# Patient Record
Sex: Female | Born: 1950 | Race: White | Hispanic: No | Marital: Married | Smoking: Former smoker
Health system: Southern US, Community
[De-identification: ages and names within clinical notes are randomized; demographics above are authoritative.]

## PROBLEM LIST (undated history)

## (undated) DIAGNOSIS — I8390 Asymptomatic varicose veins of unspecified lower extremity: Secondary | ICD-10-CM

## (undated) DIAGNOSIS — K432 Incisional hernia without obstruction or gangrene: Secondary | ICD-10-CM

## (undated) DIAGNOSIS — K429 Umbilical hernia without obstruction or gangrene: Secondary | ICD-10-CM

## (undated) DIAGNOSIS — J189 Pneumonia, unspecified organism: Secondary | ICD-10-CM

## (undated) DIAGNOSIS — T7840XA Allergy, unspecified, initial encounter: Secondary | ICD-10-CM

## (undated) DIAGNOSIS — J45909 Unspecified asthma, uncomplicated: Secondary | ICD-10-CM

## (undated) HISTORY — DX: Allergy, unspecified, initial encounter: T78.40XA

## (undated) HISTORY — DX: Unspecified asthma, uncomplicated: J45.909

## (undated) HISTORY — DX: Asymptomatic varicose veins of unspecified lower extremity: I83.90

## (undated) HISTORY — DX: Umbilical hernia without obstruction or gangrene: K42.9

## (undated) HISTORY — PX: OTHER SURGICAL HISTORY: SHX169

---

## 2002-01-05 ENCOUNTER — Other Ambulatory Visit: Admission: RE | Admit: 2002-01-05 | Discharge: 2002-01-05 | Payer: Self-pay | Admitting: Obstetrics and Gynecology

## 2003-08-22 ENCOUNTER — Other Ambulatory Visit: Admission: RE | Admit: 2003-08-22 | Discharge: 2003-08-22 | Payer: Self-pay | Admitting: Obstetrics and Gynecology

## 2004-04-03 ENCOUNTER — Encounter (INDEPENDENT_AMBULATORY_CARE_PROVIDER_SITE_OTHER): Payer: Self-pay | Admitting: Specialist

## 2004-04-03 ENCOUNTER — Ambulatory Visit (HOSPITAL_BASED_OUTPATIENT_CLINIC_OR_DEPARTMENT_OTHER): Admission: RE | Admit: 2004-04-03 | Discharge: 2004-04-03 | Payer: Self-pay | Admitting: Obstetrics and Gynecology

## 2004-04-03 ENCOUNTER — Ambulatory Visit (HOSPITAL_COMMUNITY): Admission: RE | Admit: 2004-04-03 | Discharge: 2004-04-03 | Payer: Self-pay | Admitting: Obstetrics and Gynecology

## 2005-02-25 ENCOUNTER — Emergency Department (HOSPITAL_COMMUNITY): Admission: EM | Admit: 2005-02-25 | Discharge: 2005-02-25 | Payer: Self-pay | Admitting: *Deleted

## 2006-02-13 IMAGING — CR DG CHEST 1V PORT
1 series · 1 of 1 positions shown · non-contrast
Comparison: none

CLINICAL DATA: right chest pain

Portable chest at 2680:
No previous for comparison. The heart size and mediastinal contours are within
normal limits.  Both lungs are clear.  The visualized skeletal structures are
unremarkable.

[view not recorded]
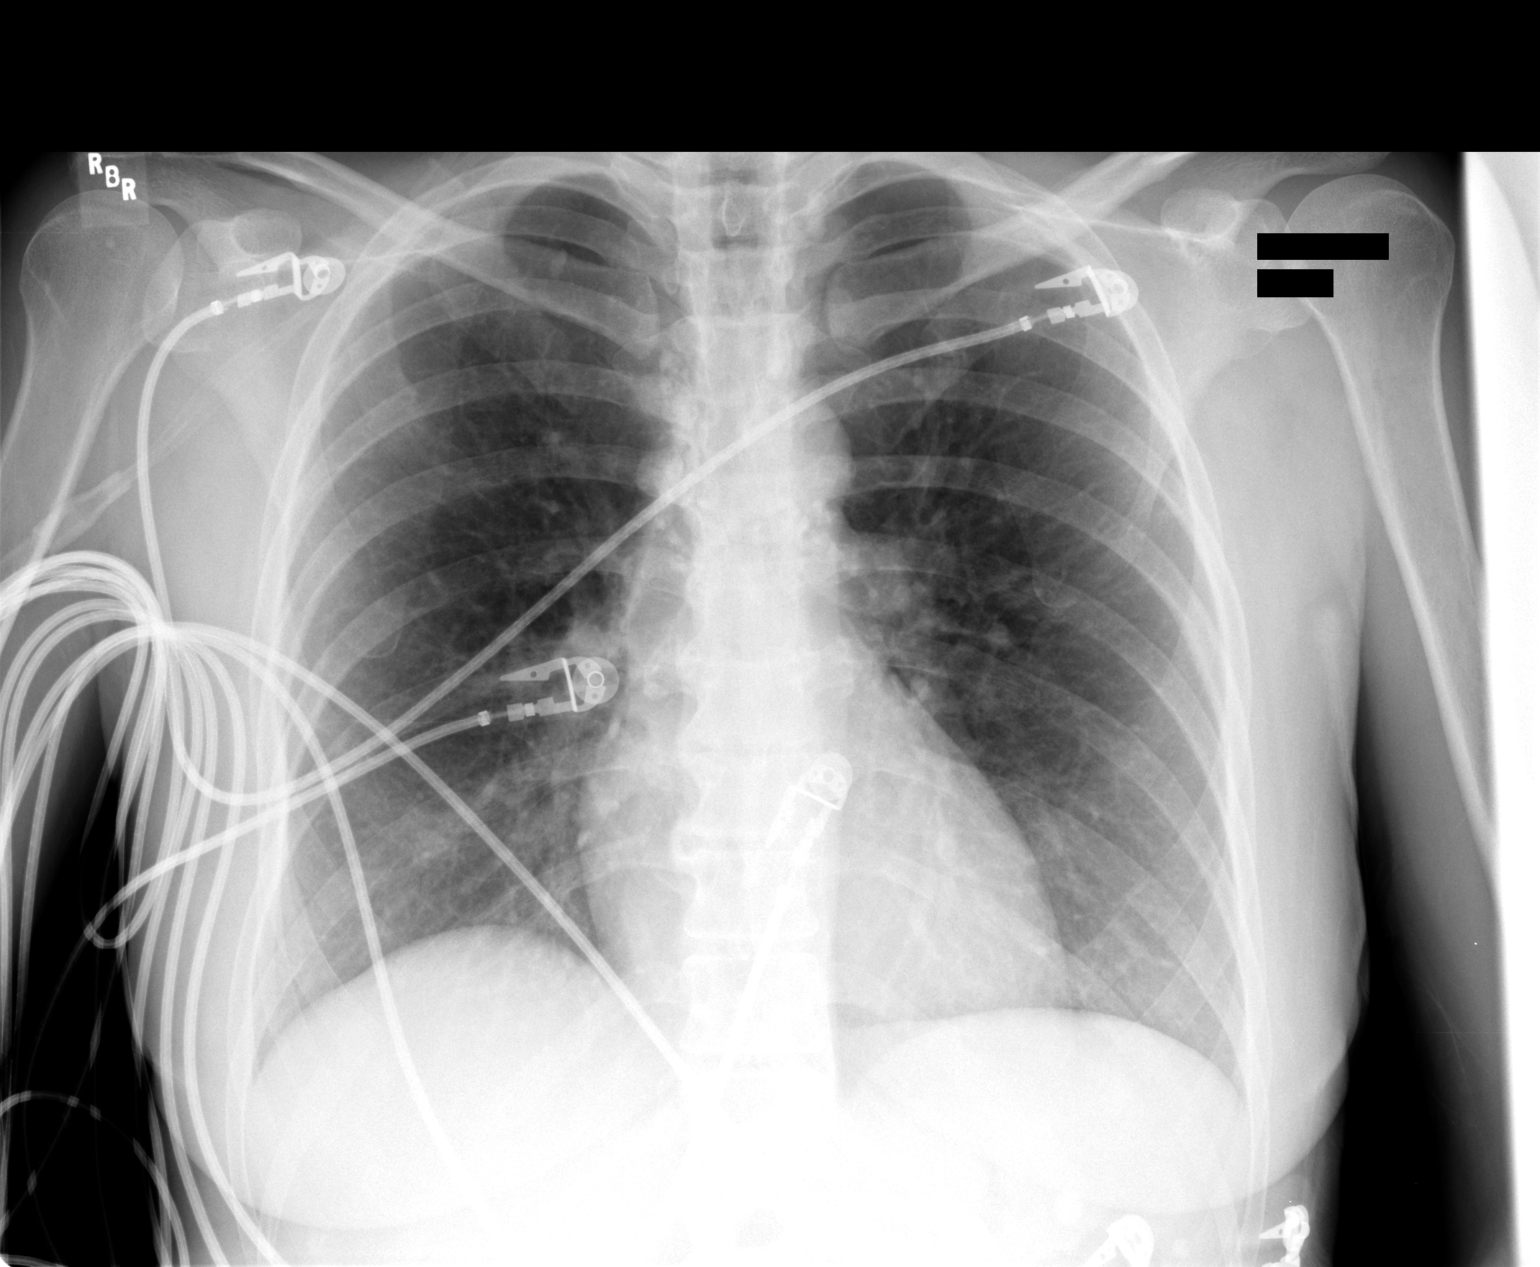

[1 of 1 positions shown; findings below may reference images not displayed]

IMPRESSION: 1. No active cardiopulmonary disease.

## 2006-09-23 ENCOUNTER — Ambulatory Visit (HOSPITAL_COMMUNITY): Admission: RE | Admit: 2006-09-23 | Discharge: 2006-09-23 | Payer: Self-pay | Admitting: Obstetrics and Gynecology

## 2006-10-06 ENCOUNTER — Encounter: Admission: RE | Admit: 2006-10-06 | Discharge: 2006-10-06 | Payer: Self-pay | Admitting: Obstetrics and Gynecology

## 2009-03-09 ENCOUNTER — Encounter: Admission: RE | Admit: 2009-03-09 | Discharge: 2009-03-09 | Payer: Self-pay | Admitting: Orthopedic Surgery

## 2010-02-23 ENCOUNTER — Encounter: Admission: RE | Admit: 2010-02-23 | Discharge: 2010-02-23 | Payer: Self-pay | Admitting: Obstetrics and Gynecology

## 2010-02-25 IMAGING — RF DG FLUORO GUIDE NDL PLC/BX
2 series · 2 of 2 positions shown · non-contrast
Comparison: none

CLINICAL DATA: Right hip pain

[Series 1: (hospital) · 1 of 1 slices shown (1 of 2)]
[im 1/1]
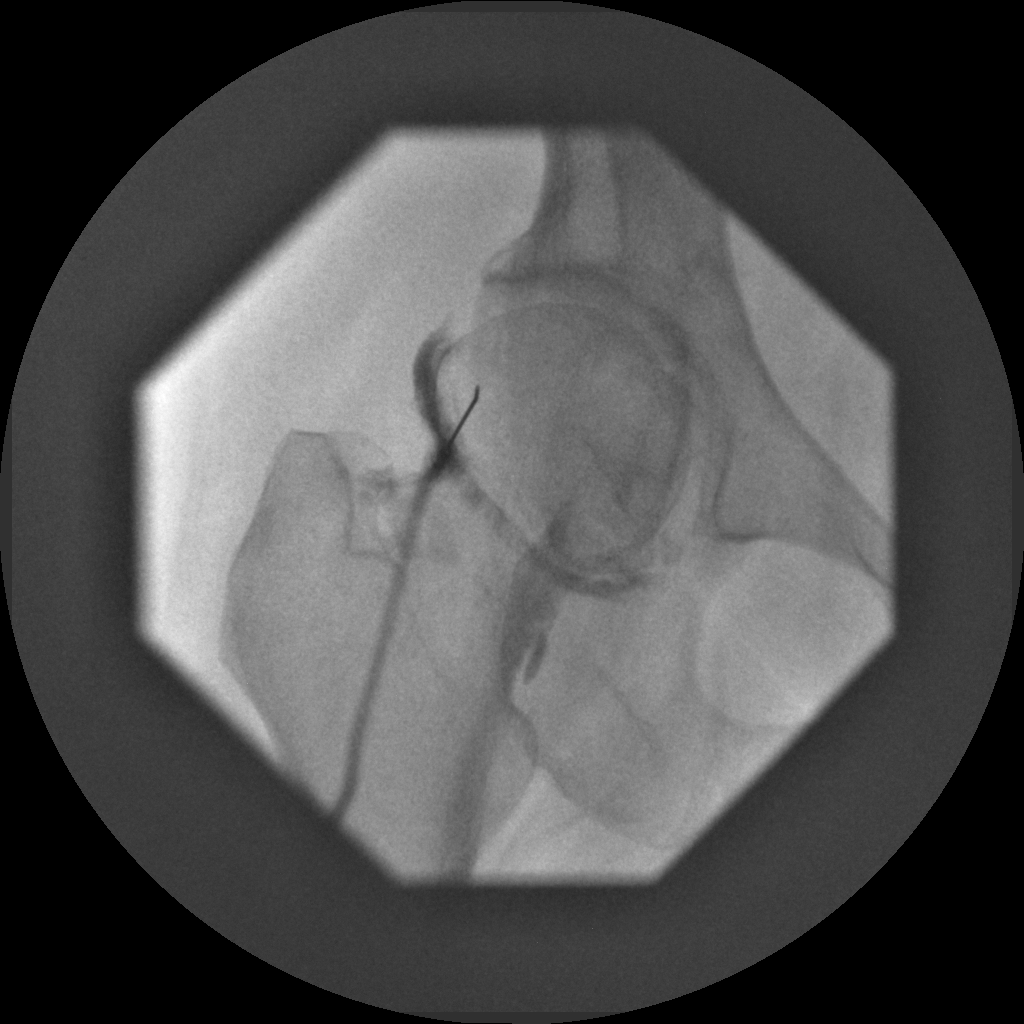

[Series 2: (hospital) · 1 of 1 slices shown (2 of 2)]
[im 1/1]
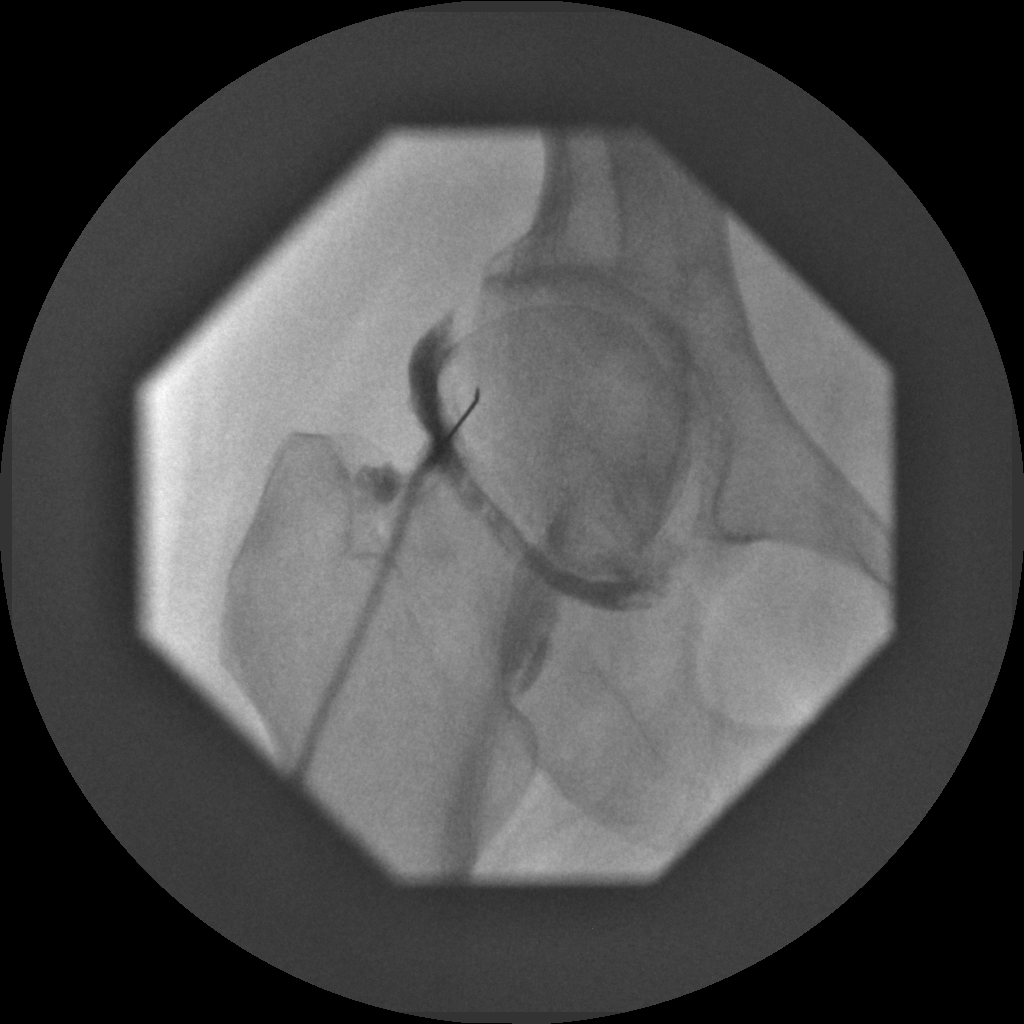

[2 of 2 positions shown; findings below may reference images not displayed]

RIGHT HIP INJECTION UNDER FLUOROSCOPY FOR MRI

An appropriate skin entry site was determined under fluoroscopy.
Skin site was marked, prepped with Betadine, and draped in usual
sterile fashion, and infiltrated locally with 1% lidocaine.
22-gauge spinal needle advanced to the superior lateral margin of
the femoral head. 1 mL of lidocaine 1% injected easily. Eightml of
a mixture of 10ml Shyarul Narudem 60, 5 ml lidocaine 1%, 5 ml normal saline,
and 0.2 ml Magnevist   was injected into the hip joint.
Intraarticular flow was confirmed on fluoroscopy. Patient
transferred to MRI.

IMPRESSION
1. Technically successful right hip injection for MRI

## 2010-12-29 ENCOUNTER — Encounter: Payer: Self-pay | Admitting: Obstetrics and Gynecology

## 2010-12-30 ENCOUNTER — Encounter: Payer: Self-pay | Admitting: Obstetrics and Gynecology

## 2011-04-26 NOTE — Op Note (Signed)
Michele Thornton, Michele Thornton                             ACCOUNT NO.:  [id number]   MEDICAL RECORD NO.:  [id number]                   PATIENT TYPE:  AMB   LOCATION:  [facility]                                 FACILITY:  [facility]   PHYSICIAN:  Sherry A. Rosalio Macadamia, M.D.           DATE OF BIRTH:  01/19/1951   DATE OF PROCEDURE:  04/03/2004  DATE OF DISCHARGE:                                 OPERATIVE REPORT   PREOPERATIVE DIAGNOSES:  Postmenopausal bleeding, thickened endometrium.   POSTOPERATIVE DIAGNOSES:  Postmenopausal bleeding, thickened endometrium.   PROCEDURE:  D&C hysteroscopy with resectoscope.   SURGEON:  Sherry A. Rosalio Macadamia, M.D.   ANESTHESIA:  MAC.   INDICATIONS FOR PROCEDURE:  This is a 60 year old, G2, P2-0-0-2 woman who  has been menopausal for several years. The patient has been on and off  hormone replacement therapy for relief of symptoms.  The patient's currently  Climara pro beginning in September of 2004.  The patient has had irregular  bleeding since that time and the bleeding persisted the past six months.  Ultrasound was performed revealing essentially normal uterus with a very  small fibroid present and thickened endometrium with a hypoechoic mass  present.  Because of this, the patient is brought to the operating room for  [facility] hysteroscopy with a resectoscope for presumed polyps.   FINDINGS:  Normal size anteflexed uterus, irregular bloody endometrial  patches.   DESCRIPTION OF PROCEDURE:  The patient was brought into the operating room,  given adequate IV sedation, she was placed in dorsal lithotomy position, her  perineum and vagina were washed with Hibiclens, bladder was in and out  catheterized. The patient was draped in a sterile fashion, speculum was  placed within the vagina, paracervical block was administered with 1%  Nesacaine. The anterior lip of the cervix was grasped with a single tooth  tenaculum. The cervix was sounded and dilated with Pratt dilators to a  #31.  The hysteroscope was introduced, pictures were obtained. There was some  shaggy irregular endometrium and some bloody patches on the endometrial  surface. Using a double loop right angled resector, sheets of tissue were  taken circumferentially for sampling. Bleeders were cauterized, adequate  hemostasis was present. All instruments removed from the vagina, the patient  taken out of the dorsal lithotomy position. She was awakened, she was  removed from the operating table to a stretcher in stable condition.  Complications were none. Estimated blood loss less than 5 mL. Sorbitol  differential -10 mL.   SPECIMENS:  Endometrial resections.                                               Sherry A. Rosalio Macadamia, M.D.    SAD/MEDQ  D:  04/03/2004  T:  04/03/2004  Job:  [id number]

## 2011-10-22 ENCOUNTER — Other Ambulatory Visit: Payer: Self-pay | Admitting: Obstetrics

## 2011-10-22 DIAGNOSIS — Z1231 Encounter for screening mammogram for malignant neoplasm of breast: Secondary | ICD-10-CM

## 2011-10-22 DIAGNOSIS — Z780 Asymptomatic menopausal state: Secondary | ICD-10-CM

## 2011-10-22 DIAGNOSIS — E2839 Other primary ovarian failure: Secondary | ICD-10-CM

## 2011-11-27 ENCOUNTER — Ambulatory Visit: Payer: Self-pay

## 2011-11-27 ENCOUNTER — Inpatient Hospital Stay: Admission: RE | Admit: 2011-11-27 | Payer: Self-pay | Source: Ambulatory Visit

## 2012-09-18 ENCOUNTER — Ambulatory Visit
Admission: RE | Admit: 2012-09-18 | Discharge: 2012-09-18 | Disposition: A | Discharge status: Home / Self Care | Payer: BC Managed Care – PPO | Source: Ambulatory Visit | Attending: Obstetrics | Admitting: Obstetrics

## 2012-09-18 DIAGNOSIS — Z780 Asymptomatic menopausal state: Secondary | ICD-10-CM

## 2012-09-18 DIAGNOSIS — Z1231 Encounter for screening mammogram for malignant neoplasm of breast: Secondary | ICD-10-CM

## 2012-09-18 DIAGNOSIS — E2839 Other primary ovarian failure: Secondary | ICD-10-CM

## 2013-09-20 ENCOUNTER — Other Ambulatory Visit: Payer: Self-pay

## 2013-09-20 DIAGNOSIS — Z1231 Encounter for screening mammogram for malignant neoplasm of breast: Secondary | ICD-10-CM

## 2013-09-21 ENCOUNTER — Ambulatory Visit
Admission: RE | Admit: 2013-09-21 | Discharge: 2013-09-21 | Disposition: A | Discharge status: Home / Self Care | Payer: Self-pay | Source: Ambulatory Visit

## 2013-09-21 DIAGNOSIS — Z1231 Encounter for screening mammogram for malignant neoplasm of breast: Secondary | ICD-10-CM

## 2013-09-27 ENCOUNTER — Other Ambulatory Visit: Payer: Self-pay | Admitting: Obstetrics

## 2013-09-27 DIAGNOSIS — R928 Other abnormal and inconclusive findings on diagnostic imaging of breast: Secondary | ICD-10-CM

## 2014-01-26 ENCOUNTER — Other Ambulatory Visit: Payer: PRIVATE HEALTH INSURANCE

## 2014-02-07 ENCOUNTER — Other Ambulatory Visit: Payer: PRIVATE HEALTH INSURANCE

## 2014-02-16 ENCOUNTER — Other Ambulatory Visit: Payer: PRIVATE HEALTH INSURANCE

## 2014-02-24 ENCOUNTER — Other Ambulatory Visit: Payer: PRIVATE HEALTH INSURANCE

## 2014-03-03 ENCOUNTER — Ambulatory Visit
Admission: RE | Admit: 2014-03-03 | Discharge: 2014-03-03 | Disposition: A | Discharge status: Home / Self Care | Payer: No Typology Code available for payment source | Source: Ambulatory Visit | Attending: Obstetrics | Admitting: Obstetrics

## 2014-03-03 DIAGNOSIS — R928 Other abnormal and inconclusive findings on diagnostic imaging of breast: Secondary | ICD-10-CM

## 2016-06-25 ENCOUNTER — Other Ambulatory Visit: Payer: Self-pay | Admitting: *Deleted

## 2016-06-27 ENCOUNTER — Encounter: Payer: Self-pay | Admitting: Vascular Surgery

## 2016-07-01 ENCOUNTER — Encounter: Payer: Self-pay | Admitting: Vascular Surgery

## 2016-07-01 ENCOUNTER — Ambulatory Visit (INDEPENDENT_AMBULATORY_CARE_PROVIDER_SITE_OTHER): Payer: 59 | Admitting: Vascular Surgery

## 2016-07-01 VITALS — BP 100/68 | HR 85 | Temp 97.2°F | Resp 14 | Ht 61.0 in | Wt 180.0 lb

## 2016-07-01 DIAGNOSIS — I83893 Varicose veins of bilateral lower extremities with other complications: Secondary | ICD-10-CM | POA: Insufficient documentation

## 2016-07-01 NOTE — Progress Notes (Signed)
Subjective:     Patient ID: Michele Thornton, female   DOB: 03-13-1951, 65 y.o.   MRN: [id number]  HPI this 65 year old female was referred by Prescilla Sours, NP for evaluation of bilateral painful varicose veins. She has been seen in the past at  [facility] and had saline injections of spider veins which were very painful and she discontinued this. She also had recommendation of laser ablation of a vein in the right leg. This was not performed. The patient has increasing itching and burning and stinging discomfort of both legs right worse than left. This is mostly in the thigh and calf. She develops swelling in the right ankle as the day progresses. She does have a long leg elastic compression stockings 20-30 millimeter gradient but has not worn them on a consistent basis in the past. She has no history of DVT thrombophlebitis stasis ulcers or bleeding. Right leg symptoms have worsened and are affecting her daily living.  Past Medical History:  Diagnosis Date  . Allergy   . Asthma   . Umbilical hernia   . Varicose veins     Social History  Substance Use Topics  . Smoking status: Former Smoker    Types: Cigarettes    Quit date: 07/01/2016  . Smokeless tobacco: Never Used  . Alcohol use Yes     Comment: monthly    Family History  Problem Relation Age of Onset  . Scoliosis Mother   . Arthritis Mother   . Stroke Mother     Allergies  Allergen Reactions  . Sulfa Antibiotics Other (See Comments)    Unknown     Current Outpatient Prescriptions:  .  budesonide-formoterol (SYMBICORT) 160-4.5 MCG/ACT inhaler, Inhale 2 puffs into the lungs 2 (two) times daily., Disp: , Rfl:  .  Fexofenadine HCl (ALLEGRA PO), Take by mouth as needed., Disp: , Rfl:  .  ranitidine (ZANTAC) 300 MG tablet, Take 300 mg by mouth at bedtime., Disp: , Rfl:  .  albuterol (VENTOLIN HFA) 108 (90 BASE) MCG/ACT inhaler, Inhale 2 puffs into the lungs every 4 (four) hours as needed for wheezing or shortness of breath.,  Disp: , Rfl:  .  ESTRADIOL PO, Take by mouth daily., Disp: , Rfl:  .  mometasone (NASONEX) 50 MCG/ACT nasal spray, Place 1 spray into the nose daily., Disp: , Rfl:  .  montelukast (SINGULAIR) 10 MG tablet, Take 10 mg by mouth at bedtime., Disp: , Rfl:  .  omeprazole (PRILOSEC) 40 MG capsule, Take 40 mg by mouth daily., Disp: , Rfl:  .  progesterone (PROMETRIUM) 100 MG capsule, Take 100 mg by mouth daily., Disp: , Rfl:   Vitals:   07/01/16 1355  BP: 100/68  Pulse: 85  Resp: 14  Temp: 97.2 F (36.2 C)  SpO2: 96%  Weight: 180 lb (81.6 kg)  Height:  (1.549 m)    Body mass index is 34.01 kg/m.       \ Review of Systems  patient has history of asthma with dyspnea on exertion. Denies chest pain, hemoptysis, PND, orthopnea, claudication. Has hyperlipidemia. Denies previous CVA, does have pre-diabetes but is not treated medically. Other systems negative and complete review of systems  Objective:   Physical Exam BP 100/68 (BP Location: Left Arm, Patient Position: Sitting, Cuff Size: Normal)   Pulse 85   Temp 97.2 F (36.2 C)   Resp 14   Ht  (1.549 m)   Wt 180 lb (81.6 kg)   SpO2 96%  BMI 34.01 kg/m     Gen.-alert and oriented x3 in no apparent distress-obese HEENT normal for age Lungs no rhonchi or wheezing Cardiovascular regular rhythm no murmurs carotid pulses 3+ palpable no bruits audible Abdomen soft nontender no palpable masses Musculoskeletal free of  major deformities Skin clear -no rashes Neurologic normal Lower extremities 3+ femoral and dorsalis pedis pulses palpable bilaterally with no edema on the left 1+ on the right Diffuse spider veins bilateral medial and lateral thigh area. 1+ edema on the right. Bulging varicosities beginning in the medial anterior thigh on the right extending down across the patella into the pretibial region. No ulceration noted bilaterally.  Today I performed a bedside sono site ultrasound exam. Patient does have an enlarged  great saphenous vein on the right with apparent reflux. Left great saphenous vein appears normal in size with no reflux.       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

## 2016-09-11 ENCOUNTER — Other Ambulatory Visit: Payer: Self-pay | Admitting: *Deleted

## 2016-09-11 DIAGNOSIS — I83893 Varicose veins of bilateral lower extremities with other complications: Secondary | ICD-10-CM

## 2016-09-16 ENCOUNTER — Inpatient Hospital Stay (HOSPITAL_COMMUNITY): Admission: RE | Admit: 2016-09-16 | Payer: 59 | Source: Ambulatory Visit

## 2016-10-01 ENCOUNTER — Ambulatory Visit: Payer: 59 | Admitting: Vascular Surgery

## 2016-10-09 ENCOUNTER — Encounter: Payer: Self-pay | Admitting: Vascular Surgery

## 2016-10-11 ENCOUNTER — Encounter (HOSPITAL_COMMUNITY): Payer: 59

## 2016-10-14 ENCOUNTER — Ambulatory Visit: Payer: 59 | Admitting: Vascular Surgery

## 2016-10-16 ENCOUNTER — Ambulatory Visit (HOSPITAL_COMMUNITY)
Admission: RE | Admit: 2016-10-16 | Discharge: 2016-10-16 | Disposition: A | Discharge status: Home / Self Care | Payer: 59 | Source: Ambulatory Visit | Attending: Vascular Surgery | Admitting: Vascular Surgery

## 2016-10-16 DIAGNOSIS — I83893 Varicose veins of bilateral lower extremities with other complications: Secondary | ICD-10-CM | POA: Insufficient documentation

## 2016-11-13 ENCOUNTER — Encounter: Payer: Self-pay | Admitting: Vascular Surgery

## 2016-11-18 ENCOUNTER — Ambulatory Visit (INDEPENDENT_AMBULATORY_CARE_PROVIDER_SITE_OTHER): Payer: 59 | Admitting: Vascular Surgery

## 2016-11-18 ENCOUNTER — Encounter: Payer: Self-pay | Admitting: Vascular Surgery

## 2016-11-18 VITALS — BP 112/68 | HR 86 | Temp 97.4°F | Resp 18 | Ht 61.0 in | Wt 181.9 lb

## 2016-11-18 DIAGNOSIS — I83893 Varicose veins of bilateral lower extremities with other complications: Secondary | ICD-10-CM

## 2016-11-18 NOTE — Progress Notes (Signed)
Subjective:     Patient ID: Michele Thornton, female   DOB: [birth year], 65 y.o.   MRN: [id number]  HPI This 65 year old female returns for further discussion regarding her pain and swelling in both lower extremities. She has aching and throbbing discomfort in the thigh and calf area. She does have multiple spider veins. She does have right hip discomfort and will need hip surgery in the near future. She has no history of DVT or thrombophlebitis. She is tried long-leg elastic compression stockings with no improvement in her symptoms.  Past Medical History:  Diagnosis Date  . Allergy   . Asthma   . Umbilical hernia   . Varicose veins     Social History  Substance Use Topics  . Smoking status: Former Smoker    Types: Cigarettes    Quit date: 07/01/2016  . Smokeless tobacco: Never Used  . Alcohol use Yes     Comment: monthly    Family History  Problem Relation Age of Onset  . Scoliosis Mother   . Arthritis Mother   . Stroke Mother     Allergies  Allergen Reactions  . Sulfa Antibiotics Other (See Comments)    Unknown     Current Outpatient Prescriptions:  .  albuterol (VENTOLIN HFA) 108 (90 BASE) MCG/ACT inhaler, Inhale 2 puffs into the lungs every 4 (four) hours as needed for wheezing or shortness of breath., Disp: , Rfl:  .  budesonide-formoterol (SYMBICORT) 160-4.5 MCG/ACT inhaler, Inhale 2 puffs into the lungs 2 (two) times daily., Disp: , Rfl:  .  ESTRADIOL PO, Take by mouth daily., Disp: , Rfl:  .  Fexofenadine HCl (ALLEGRA PO), Take by mouth as needed., Disp: , Rfl:  .  mometasone (NASONEX) 50 MCG/ACT nasal spray, Place 1 spray into the nose daily., Disp: , Rfl:  .  montelukast (SINGULAIR) 10 MG tablet, Take 10 mg by mouth at bedtime., Disp: , Rfl:  .  omeprazole (PRILOSEC) 40 MG capsule, Take 40 mg by mouth daily., Disp: , Rfl:  .  progesterone (PROMETRIUM) 100 MG capsule, Take 100 mg by mouth daily., Disp: , Rfl:  .  ranitidine (ZANTAC) 300 MG tablet, Take 300 mg by mouth  at bedtime., Disp: , Rfl:   Vitals:   11/18/16 1504  BP: 112/68  Pulse: 86  Resp: 18  Temp: 97.4 F (36.3 C)  TempSrc: Oral  SpO2: 96%  Weight: 181 lb 14.4 oz (82.5 kg)  Height: 5\' 1"  (1.549 m)    Body mass index is 34.37 kg/m.         Review of Systems denies chest pain, dyspnea on exertion, PND, orthopnea, hemoptysis     Objective:   Physical Exam  BP 112/68 (BP Location: Left Arm, Patient Position: Sitting, Cuff Size: Normal)   Pulse 86   Temp 97.4 F (36.3 C) (Oral)   Resp 18   Ht 5\' 1"  (1.549 m)   Wt 181 lb 14.4 oz (82.5 kg)   SpO2 96%   BMI 34.37 kg/m   Gen. well-developed well-nourished female no apparent distress alert and oriented 3 Lungs no rhonchi or wheezing Both legs with scattered either veins medial and lateral thighs. Bulging varicosities in the anterior distal right thigh extending down to patella. 1+ edema bilaterally. No hyperpigmentation or ulceration noted.  Today I reviewed the formal venous duplex exam which was performed 10/16/2016. This reveals no reflux in the great saphenous veins or small saphenous veins bilaterally and only minimal reflux in the deep system at the  common femoral vein level    Assessment:     Bilateral spider veins and small varicosities right leg with no evidence gross reflux great or small saphenous veins    Plan:     Have recommended foam sclerotherapy is only treatment option if patient would like to be treated She will consider this She is to have hip surgery on the right side in the near future

## 2017-03-06 ENCOUNTER — Ambulatory Visit: Payer: Self-pay | Admitting: Orthopedic Surgery

## 2017-03-11 ENCOUNTER — Other Ambulatory Visit (HOSPITAL_COMMUNITY): Payer: Self-pay | Admitting: Emergency Medicine

## 2017-03-11 NOTE — Patient Instructions (Addendum)
Manami Tutor  03/11/2017   Your procedure is scheduled on: 03-19-17  Report to [facility] Main  Entrance take [facility]  elevators to 3rd floor to  Short Stay Center at 12PM.  Call this number if you have problems the morning of surgery [phone]   Remember: ONLY 1 PERSON MAY GO WITH YOU TO SHORT STAY TO GET  READY MORNING OF YOUR SURGERY.  Do not eat food After Midnight. YOU MAY HAVE CLEAR LIQUIDS FROM MIDNIGHT UNTIL 8AM DAY OF SURGERY. NOTHING BY MOUTH AFTER 8AM!!     Take these medicines the morning of surgery with A SIP OF WATER: TYLENOL AS NEEDED, TRAMADOL AS NEEDED, INHALER AS NEEDED(MAY BRING TO HOSPITAL), EYE DROPS AS NEEDED                                You may not have any metal on your body including hair pins and              piercings  Do not wear jewelry, make-up, lotions, powders or perfumes, deodorant             Do not wear nail polish.  Do not shave  48 hours prior to surgery.            Do not bring valuables to the hospital. [facility] IS NOT             RESPONSIBLE   FOR VALUABLES.  Contacts, dentures or bridgework may not be worn into surgery.  Leave suitcase in the car. After surgery it may be brought to your room.              Please read over the following fact sheets you were given: _____________________________________________________________________                CLEAR LIQUID DIET   Foods Allowed                                                                     Foods Excluded  Coffee and tea, regular and decaf                             liquids that you cannot  Plain Jell-O in any flavor                                             see through such as: Fruit ices (not with fruit pulp)                                     milk, soups, orange juice  Iced Popsicles                                    All solid food Carbonated beverages, regular and  diet                                    Cranberry, grape and apple  juices Sports drinks like Gatorade Lightly seasoned clear broth or consume(fat free) Sugar, honey syrup  Sample Menu Breakfast                                Lunch                                     Supper Cranberry juice                    Beef broth                            Chicken broth Jell-O                                     Grape juice                           Apple juice Coffee or tea                        Jell-O                                      Popsicle                                                Coffee or tea                        Coffee or tea  _____________________________________________________________________  [facility] - Preparing for Surgery Before surgery, you can play an important role.  Because skin is not sterile, your skin needs to be as free of germs as possible.  You can reduce the number of germs on your skin by washing with CHG (chlorahexidine gluconate) soap before surgery.  CHG is an antiseptic cleaner which kills germs and bonds with the skin to continue killing germs even after washing. Please DO NOT use if you have an allergy to CHG or antibacterial soaps.  If your skin becomes reddened/irritated stop using the CHG and inform your nurse when you arrive at Short Stay. Do not shave (including legs and underarms) for at least 48 hours prior to the first CHG shower.  You may shave your face/neck. Please follow these instructions carefully:  1.  Shower with CHG Soap the night before surgery and the  morning of Surgery.  2.  If you choose to wash your hair, wash your hair first as usual with your  normal  shampoo.  3.  After you shampoo, rinse your hair and body thoroughly to remove the  shampoo.  4.  Use CHG as you would any other liquid soap.  You can apply chg directly  to the skin and wash                       Gently with a scrungie or clean washcloth.  5.  Apply the CHG Soap to your body ONLY FROM THE NECK DOWN.   Do not  use on face/ open                           Wound or open sores. Avoid contact with eyes, ears mouth and genitals (private parts).                       Wash face,  Genitals (private parts) with your normal soap.             6.  Wash thoroughly, paying special attention to the area where your surgery  will be performed.  7.  Thoroughly rinse your body with warm water from the neck down.  8.  DO NOT shower/wash with your normal soap after using and rinsing off  the CHG Soap.                9.  Pat yourself dry with a clean towel.            10.  Wear clean pajamas.            11.  Place clean sheets on your bed the night of your first shower and do not  sleep with pets. Day of Surgery : Do not apply any lotions/deodorants the morning of surgery.  Please wear clean clothes to the hospital/surgery center.  FAILURE TO FOLLOW THESE INSTRUCTIONS MAY RESULT IN THE CANCELLATION OF YOUR SURGERY PATIENT SIGNATURE_________________________________  NURSE SIGNATURE__________________________________  ________________________________________________________________________   Adam Phenix  An incentive spirometer is a tool that can help keep your lungs clear and active. This tool measures how well you are filling your lungs with each breath. Taking long deep breaths may help reverse or decrease the chance of developing breathing (pulmonary) problems (especially infection) following:  A long period of time when you are unable to move or be active. BEFORE THE PROCEDURE   If the spirometer includes an indicator to show your best effort, your nurse or respiratory therapist will set it to a desired goal.  If possible, sit up straight or lean slightly forward. Try not to slouch.  Hold the incentive spirometer in an upright position. INSTRUCTIONS FOR USE  1. Sit on the edge of your bed if possible, or sit up as far as you can in bed or on a chair. 2. Hold the incentive spirometer in an upright  position. 3. Breathe out normally. 4. Place the mouthpiece in your mouth and seal your lips tightly around it. 5. Breathe in slowly and as deeply as possible, raising the piston or the ball toward the top of the column. 6. Hold your breath for 3-5 seconds or for as long as possible. Allow the piston or ball to fall to the bottom of the column. 7. Remove the mouthpiece from your mouth and breathe out normally. 8. Rest for a few seconds and repeat Steps 1 through 7 at least 10 times every 1-2 hours when you are awake. Take your time and take a few normal breaths between deep breaths. 9. The spirometer may include an indicator to  show your best effort. Use the indicator as a goal to work toward during each repetition. 10. After each set of 10 deep breaths, practice coughing to be sure your lungs are clear. If you have an incision (the cut made at the time of surgery), support your incision when coughing by placing a pillow or rolled up towels firmly against it. Once you are able to get out of bed, walk around indoors and cough well. You may stop using the incentive spirometer when instructed by your caregiver.  RISKS AND COMPLICATIONS  Take your time so you do not get dizzy or light-headed.  If you are in pain, you may need to take or ask for pain medication before doing incentive spirometry. It is harder to take a deep breath if you are having pain. AFTER USE  Rest and breathe slowly and easily.  It can be helpful to keep track of a log of your progress. Your caregiver can provide you with a simple table to help with this. If you are using the spirometer at home, follow these instructions: [place] IF:   You are having difficultly using the spirometer.  You have trouble using the spirometer as often as instructed.  Your pain medication is not giving enough relief while using the spirometer.  You develop fever of 100.5 F (38.1 C) or higher. SEEK IMMEDIATE MEDICAL CARE IF:    You cough up bloody sputum that had not been present before.  You develop fever of 102 F (38.9 C) or greater.  You develop worsening pain at or near the incision site. MAKE SURE YOU:   Understand these instructions.  Will watch your condition.  Will get help right away if you are not doing well or get worse. Document Released: 04/07/2007 Document Revised: 02/17/2012 Document Reviewed: 06/08/2007 ExitCare Patient Information 2014 Exit[employer], [place].   ________________________________________________________________________  WHAT IS A BLOOD TRANSFUSION? Blood Transfusion Information  A transfusion is the replacement of blood or some of its parts. Blood is made up of multiple cells which provide different functions.  Red blood cells carry oxygen and are used for blood loss replacement.  White blood cells fight against infection.  Platelets control bleeding.  Plasma helps clot blood.  Other blood products are available for specialized needs, such as hemophilia or other clotting disorders. BEFORE THE TRANSFUSION  Who gives blood for transfusions?   Healthy volunteers who are fully evaluated to make sure their blood is safe. This is blood bank blood. Transfusion therapy is the safest it has ever been in the practice of medicine. Before blood is taken from a donor, a complete history is taken to make sure that person has no history of diseases nor engages in risky social behavior (examples are intravenous drug use or sexual activity with multiple partners). The donor's travel history is screened to minimize risk of transmitting infections, such as malaria. The donated blood is tested for signs of infectious diseases, such as HIV and hepatitis. The blood is then tested to be sure it is compatible with you in order to minimize the chance of a transfusion reaction. If you or a relative donates blood, this is often done in anticipation of surgery and is not appropriate for emergency  situations. It takes many days to process the donated blood. RISKS AND COMPLICATIONS Although transfusion therapy is very safe and saves many lives, the main dangers of transfusion include:   Getting an infectious disease.  Developing a transfusion reaction. This is an allergic reaction  to something in the blood you were given. Every precaution is taken to prevent this. The decision to have a blood transfusion has been considered carefully by your caregiver before blood is given. Blood is not given unless the benefits outweigh the risks. AFTER THE TRANSFUSION  Right after receiving a blood transfusion, you will usually feel much better and more energetic. This is especially true if your red blood cells have gotten low (anemic). The transfusion raises the level of the red blood cells which carry oxygen, and this usually causes an energy increase.  The nurse administering the transfusion will monitor you carefully for complications. HOME CARE INSTRUCTIONS  No special instructions are needed after a transfusion. You may find your energy is better. Speak with your caregiver about any limitations on activity for underlying diseases you may have. SEEK MEDICAL CARE IF:   Your condition is not improving after your transfusion.  You develop redness or irritation at the intravenous (IV) site. SEEK IMMEDIATE MEDICAL CARE IF:  Any of the following symptoms occur over the next 12 hours:  Shaking chills.  You have a temperature by mouth above 102 F (38.9 C), not controlled by medicine.  Chest, back, or muscle pain.  People around you feel you are not acting correctly or are confused.  Shortness of breath or difficulty breathing.  Dizziness and fainting.  You get a rash or develop hives.  You have a decrease in urine output.  Your urine turns a dark color or changes to pink, red, or brown. Any of the following symptoms occur over the next 10 days:  You have a temperature by mouth above  102 F (38.9 C), not controlled by medicine.  Shortness of breath.  Weakness after normal activity.  The white part of the eye turns yellow (jaundice).  You have a decrease in the amount of urine or are urinating less often.  Your urine turns a dark color or changes to pink, red, or brown. Document Released: 11/22/2000 Document Revised: 02/17/2012 Document Reviewed: 07/11/2008 [facility] Patient Information 2014 [place], [place].  _______________________________________________________________________

## 2017-03-11 NOTE — Progress Notes (Signed)
lov/clearance Dr Maisie Fus 02-17-17 on chart HgA1c 02-11-17 on chart MRSA cullt 02-17-17 o chart

## 2017-03-13 ENCOUNTER — Encounter (HOSPITAL_COMMUNITY)
Admission: RE | Admit: 2017-03-13 | Discharge: 2017-03-13 | Disposition: A | Discharge status: Home / Self Care | Payer: 59 | Source: Ambulatory Visit | Attending: Orthopedic Surgery | Admitting: Orthopedic Surgery

## 2017-03-13 ENCOUNTER — Encounter (HOSPITAL_COMMUNITY): Payer: Self-pay

## 2017-03-13 DIAGNOSIS — M1611 Unilateral primary osteoarthritis, right hip: Secondary | ICD-10-CM | POA: Insufficient documentation

## 2017-03-13 DIAGNOSIS — Z01812 Encounter for preprocedural laboratory examination: Secondary | ICD-10-CM | POA: Insufficient documentation

## 2017-03-13 HISTORY — DX: Pneumonia, unspecified organism: J18.9

## 2017-03-13 HISTORY — DX: Incisional hernia without obstruction or gangrene: K43.2

## 2017-03-13 LAB — COMPREHENSIVE METABOLIC PANEL
ALBUMIN: 4.1 g/dL (ref 3.5–5.0)
ALT: 30 U/L (ref 14–54)
ANION GAP: 6 (ref 5–15)
AST: 30 U/L (ref 15–41)
Alkaline Phosphatase: 116 U/L (ref 38–126)
BUN: 18 mg/dL (ref 6–20)
CHLORIDE: 106 mmol/L (ref 101–111)
CO2: 29 mmol/L (ref 22–32)
Calcium: 9.1 mg/dL (ref 8.9–10.3)
Creatinine, Ser: 0.83 mg/dL (ref 0.44–1.00)
GFR calc Af Amer: >60 mL/min (ref >60)
GFR calc non Af Amer: >60 mL/min (ref >60)
GLUCOSE: 93 mg/dL (ref 65–99)
POTASSIUM: 4.4 mmol/L (ref 3.5–5.1)
SODIUM: 141 mmol/L (ref 135–145)
Total Bilirubin: 0.9 mg/dL (ref 0.3–1.2)
Total Protein: 7.2 g/dL (ref 6.5–8.1)

## 2017-03-13 LAB — ABO/RH: ABO/RH(D): O POS

## 2017-03-13 LAB — CBC
HCT: 40.7 % (ref 36.0–46.0)
Hemoglobin: 13.1 g/dL (ref 12.0–15.0)
MCH: 29.6 pg (ref 26.0–34.0)
MCHC: 32.2 g/dL (ref 30.0–36.0)
MCV: 91.9 fL (ref 78.0–100.0)
Platelets: 234 10*3/uL (ref 150–400)
RBC: 4.43 MIL/uL (ref 3.87–5.11)
RDW: 13.3 % (ref 11.5–15.5)
WBC: 6.1 10*3/uL (ref 4.0–10.5)

## 2017-03-13 LAB — PROTIME-INR
INR: 0.93
Prothrombin Time: 12.5 seconds (ref 11.4–15.2)

## 2017-03-13 LAB — APTT: APTT: 28 s (ref 24–36)

## 2017-03-18 ENCOUNTER — Ambulatory Visit: Payer: Self-pay | Admitting: Orthopedic Surgery

## 2017-03-18 NOTE — H&P (Signed)
Michele Thornton DOB: 03/14/1951 Married / Language: English / Race: White Female Date of Admission:  03/19/2017 CC:  Right Hip pain History of Present Illness The patient is a 66 year old female who comes in for a preoperative History and Physical. The patient is scheduled for a right total hip arthroplasty (anterior) to be performed by Dr. Gus Rankin. Aluisio, MD at [facility] on 03-19-2017. The patient is a 66 year old female who presented for follow up of their hip. The patient is being followed for their right hip pain and osteoarthritis. They are month(s) out from IA injection. Symptoms reported include: pain (soreness) and instability (limping). The patient feels that they are doing poorly and report their pain level to be moderate (constant). The following medication has been used for pain control: antiinflammatory medication (Ibuprofen and Celebrex). The patient has not gotten any relief of their symptoms with Cortisone injections (helped "some" increased throbbing but did help w/ getting up out of a chair/seated position.). She had the intraarticular injection to her right hip about a month ago. It did provide some benefit, but she is still having discomfort. Over the past year or so the hip has gotten much worse. She has pain with all activities. Generally she does have discomfort at rest. She is able to sleep at night. She is at a stage where she is having a lot of functional limitations with regards to the hip and wants to proceed with surgery at this time. They have been treated conservatively in the past for the above stated problem and despite conservative measures, they continue to have progressive pain and severe functional limitations and dysfunction. They have failed non-operative management including home exercise, medications, and injections. It is felt that they would benefit from undergoing total joint replacement. Risks and benefits of the procedure have been discussed with the  patient and they elect to proceed with surgery. There are no active contraindications to surgery such as ongoing infection or rapidly progressive neurological disease.   Problem List/Past Medical  Right hip pain (M25.551)  Primary osteoarthritis of right hip (M16.11)  Asthma  Skin Cancer  Impaired Vision  Hypercholesterolemia  Cataract  Varicose veins  Hemorrhoids  Menopause   Allergies  Sulfa Antibiotics   Family History Cerebrovascular Accident  Maternal Grandfather. Diabetes Mellitus  Paternal Grandmother. Osteoporosis  Mother. Rheumatoid Arthritis  Mother.  Social History  Current drinker  09/18/2016: Currently drinks wine and hard liquor only occasionally per week Current work status  working full time Exercise  Exercises rarely; does other Living situation  live with spouse Marital status  married No history of drug/alcohol rehab  Not under pain contract  Number of flights of stairs before winded  less than 1 Tobacco / smoke exposure  09/18/2016: no Tobacco use  Former smoker. 09/18/2016: smoke(d) 1 pack(s) per day  Medication History  Celecoxib (  Capsule, Oral) Active. Tylenol (  Tablet, Oral) Active. Equate Allergy-Sinus Headache (12.5-30-500MG  Tablet, Oral) Active. Symbicort (160-4.5MCG/ACT Aerosol, Inhalation) Active.  Past Surgical History  Dilation and Curettage of Uterus    Review of Systems  General Present- Night Sweats (postmenopausal). Not Present- Chills, Fatigue, Fever, Memory Loss, Weight Gain and Weight Loss. Skin Not Present- Eczema, Hives, Itching, Lesions and Rash. HEENT Not Present- Dentures, Double Vision, Headache, Hearing Loss, Tinnitus and Visual Loss. Respiratory Not Present- Allergies, Chronic Cough, Coughing up blood, Shortness of breath at rest and Shortness of breath with exertion. Cardiovascular Not Present- Chest Pain, Difficulty Breathing Lying Down, Murmur,  Palpitations, Racing/skipping  heartbeats and Swelling. Gastrointestinal Not Present- Abdominal Pain, Bloody Stool, Constipation, Diarrhea, Difficulty Swallowing, Heartburn, Jaundice, Loss of appetitie, Nausea and Vomiting. Female Genitourinary Not Present- Blood in Urine, Discharge, Flank Pain, Incontinence, Painful Urination, Urgency, Urinary frequency, Urinary Retention, Urinating at Night and Weak urinary stream. Musculoskeletal Not Present- Back Pain, Joint Pain, Joint Swelling, Morning Stiffness, Muscle Pain, Muscle Weakness and Spasms. Neurological Not Present- Blackout spells, Difficulty with balance, Dizziness, Paralysis, Tremor and Weakness. Psychiatric Not Present- Insomnia.  Vitals  Age 6 Pulse: 80 (Regular)  BP: 118/68 (Sitting, Left Arm, Standard)     Physical Exam General Mental Status -Alert, cooperative and good historian. General Appearance-pleasant, Not in acute distress. Orientation-Oriented X3. Build & Nutrition-Well nourished and Well developed.  Head and Neck Head-normocephalic, atraumatic . Neck Global Assessment - supple, no bruit auscultated on the right, no bruit auscultated on the left.  Eye Vision-Wears corrective lenses. Pupil - Bilateral-Regular and Round. Motion - Bilateral-EOMI.  Chest and Lung Exam Auscultation Breath sounds - clear at anterior chest wall and clear at posterior chest wall. Adventitious sounds - No Adventitious sounds.  Cardiovascular Auscultation Rhythm - Regular rate and rhythm. Heart Sounds - S1 WNL and S2 WNL. Murmurs & Other Heart Sounds - Auscultation of the heart reveals - No Murmurs.  Abdomen Palpation/Percussion Tenderness - Abdomen is non-tender to palpation. Rigidity (guarding) - Abdomen is soft. Auscultation Auscultation of the abdomen reveals - Bowel sounds normal.  Female Genitourinary Note: Not done, not pertinent to present illness   Musculoskeletal Note: On exam, well-developed female, alert and oriented, in  no apparent distress. Left hip has normal motion, no discomfort. Right hip flexion 100, no internal rotation, about 10 to 20 of external rotation, 20 of abduction. She has significantly antalgic gait pattern on the right.  Her radiographs are reviewed. AP pelvis, lateral of the right hip. She is essentially bone-on-bone in the right hip with subchondral cystic formation.   Assessment & Plan Primary osteoarthritis of right hip (M16.11)  Note:Surgical Plans: Right Total Hip Replacement - Anterior Approach  Disposition: Home, HHPT  PCP: Dr. Konrad Felix - Patient has been seen preoperatively and felt to be stable for surgery.  IV TXA  Anesthesia Issues: None  Patient was instructed on what medications to stop prior to surgery.  Signed electronically by Lauraine Rinne, III PA-C

## 2017-03-19 ENCOUNTER — Inpatient Hospital Stay (HOSPITAL_COMMUNITY): Payer: 59 | Admitting: Anesthesiology

## 2017-03-19 ENCOUNTER — Inpatient Hospital Stay (HOSPITAL_COMMUNITY): Payer: 59

## 2017-03-19 ENCOUNTER — Encounter (HOSPITAL_COMMUNITY): Payer: Self-pay | Admitting: *Deleted

## 2017-03-19 ENCOUNTER — Inpatient Hospital Stay (HOSPITAL_COMMUNITY)
Admission: RE | Admit: 2017-03-19 | Discharge: 2017-03-21 | DRG: 470 | Disposition: A | Discharge status: Home Health | Payer: 59 | Source: Ambulatory Visit | Attending: Orthopedic Surgery | Admitting: Orthopedic Surgery

## 2017-03-19 ENCOUNTER — Encounter (HOSPITAL_COMMUNITY)
Admission: RE | Disposition: A | Discharge status: Home Health | Payer: Self-pay | Source: Ambulatory Visit | Attending: Orthopedic Surgery

## 2017-03-19 DIAGNOSIS — M1611 Unilateral primary osteoarthritis, right hip: Secondary | ICD-10-CM | POA: Diagnosis present

## 2017-03-19 DIAGNOSIS — Z87891 Personal history of nicotine dependence: Secondary | ICD-10-CM | POA: Diagnosis not present

## 2017-03-19 DIAGNOSIS — E7800 Pure hypercholesterolemia, unspecified: Secondary | ICD-10-CM | POA: Diagnosis present

## 2017-03-19 DIAGNOSIS — J45909 Unspecified asthma, uncomplicated: Secondary | ICD-10-CM | POA: Diagnosis present

## 2017-03-19 DIAGNOSIS — I739 Peripheral vascular disease, unspecified: Secondary | ICD-10-CM | POA: Diagnosis present

## 2017-03-19 DIAGNOSIS — Z96649 Presence of unspecified artificial hip joint: Secondary | ICD-10-CM

## 2017-03-19 DIAGNOSIS — Z79899 Other long term (current) drug therapy: Secondary | ICD-10-CM | POA: Diagnosis not present

## 2017-03-19 DIAGNOSIS — M25551 Pain in right hip: Secondary | ICD-10-CM

## 2017-03-19 DIAGNOSIS — M169 Osteoarthritis of hip, unspecified: Secondary | ICD-10-CM | POA: Diagnosis present

## 2017-03-19 HISTORY — PX: TOTAL HIP ARTHROPLASTY: SHX124

## 2017-03-19 LAB — TYPE AND SCREEN
ABO/RH(D): O POS
ANTIBODY SCREEN: NEGATIVE

## 2017-03-19 SURGERY — ARTHROPLASTY, HIP, TOTAL, ANTERIOR APPROACH
Anesthesia: Spinal | Site: Hip | Laterality: Right

## 2017-03-19 MED ORDER — CEFAZOLIN SODIUM-DEXTROSE 2-4 GM/100ML-% IV SOLN
INTRAVENOUS | Status: AC
  Filled 2017-03-19: qty 100

## 2017-03-19 MED ORDER — MORPHINE SULFATE (PF) 2 MG/ML IV SOLN
1.0000 mg | INTRAVENOUS | Status: DC | PRN
  Administered 2017-03-19: 1 mg via INTRAVENOUS
  Filled 2017-03-19: qty 1

## 2017-03-19 MED ORDER — ACETAMINOPHEN 650 MG RE SUPP
650.0000 mg | Freq: Four times a day (QID) | RECTAL | Status: DC | PRN
  Filled 2017-03-19: qty 1

## 2017-03-19 MED ORDER — LACTATED RINGERS IV SOLN
INTRAVENOUS | Status: DC
Start: 2017-03-19 — End: 2017-03-19
  Administered 2017-03-19 (×2): via INTRAVENOUS

## 2017-03-19 MED ORDER — ALBUTEROL SULFATE (2.5 MG/3ML) 0.083% IN NEBU: 2.5000 mg | INHALATION_SOLUTION | RESPIRATORY_TRACT | Status: DC | PRN

## 2017-03-19 MED ORDER — PHENYLEPHRINE HCL 10 MG/ML IJ SOLN
INTRAMUSCULAR | Status: AC
  Filled 2017-03-19: qty 1

## 2017-03-19 MED ORDER — METHOCARBAMOL 500 MG PO TABS
500.0000 mg | ORAL_TABLET | Freq: Four times a day (QID) | ORAL | Status: DC | PRN
  Administered 2017-03-19 – 2017-03-21 (×5): 500 mg via ORAL
  Filled 2017-03-19 (×5): qty 1

## 2017-03-19 MED ORDER — MENTHOL 3 MG MT LOZG: 1.0000 | LOZENGE | OROMUCOSAL | Status: DC | PRN

## 2017-03-19 MED ORDER — MOMETASONE FURO-FORMOTEROL FUM 200-5 MCG/ACT IN AERO
2.0000 | INHALATION_SPRAY | Freq: Two times a day (BID) | RESPIRATORY_TRACT | Status: DC
  Administered 2017-03-19 – 2017-03-21 (×4): 2 via RESPIRATORY_TRACT
  Filled 2017-03-19: qty 8.8

## 2017-03-19 MED ORDER — DEXAMETHASONE SODIUM PHOSPHATE 10 MG/ML IJ SOLN
10.0000 mg | Freq: Once | INTRAMUSCULAR | Status: AC
  Administered 2017-03-20: 11:00:00 10 mg via INTRAVENOUS
  Filled 2017-03-19: qty 1

## 2017-03-19 MED ORDER — DEXAMETHASONE SODIUM PHOSPHATE 10 MG/ML IJ SOLN
10.0000 mg | Freq: Once | INTRAMUSCULAR | Status: AC
  Administered 2017-03-19: 10 mg via INTRAVENOUS

## 2017-03-19 MED ORDER — BUPIVACAINE HCL (PF) 0.5 % IJ SOLN
INTRAMUSCULAR | Status: DC | PRN
  Administered 2017-03-19: 15 mg

## 2017-03-19 MED ORDER — CHLORHEXIDINE GLUCONATE 4 % EX LIQD: 60.0000 mL | Freq: Once | CUTANEOUS | Status: DC

## 2017-03-19 MED ORDER — BUPIVACAINE HCL 0.25 % IJ SOLN
INTRAMUSCULAR | Status: AC
  Filled 2017-03-19: qty 1

## 2017-03-19 MED ORDER — OXYCODONE HCL 5 MG PO TABS
5.0000 mg | ORAL_TABLET | ORAL | Status: DC | PRN
  Administered 2017-03-19: 10 mg via ORAL
  Administered 2017-03-19: 18:00:00 5 mg via ORAL
  Administered 2017-03-20 – 2017-03-21 (×8): 10 mg via ORAL
  Filled 2017-03-19 (×9): qty 2
  Filled 2017-03-19: qty 1

## 2017-03-19 MED ORDER — METHOCARBAMOL 1000 MG/10ML IJ SOLN
500.0000 mg | Freq: Four times a day (QID) | INTRAVENOUS | Status: DC | PRN
  Filled 2017-03-19: qty 5

## 2017-03-19 MED ORDER — ALBUMIN HUMAN 5 % IV SOLN
INTRAVENOUS | Status: DC | PRN
  Administered 2017-03-19: 15:00:00 via INTRAVENOUS

## 2017-03-19 MED ORDER — HYDROMORPHONE HCL 2 MG/ML IJ SOLN
0.2500 mg | INTRAMUSCULAR | Status: DC | PRN
  Administered 2017-03-19 (×2): 0.5 mg via INTRAVENOUS

## 2017-03-19 MED ORDER — DOCUSATE SODIUM 100 MG PO CAPS
100.0000 mg | ORAL_CAPSULE | Freq: Two times a day (BID) | ORAL | Status: DC
  Administered 2017-03-19 – 2017-03-21 (×4): 100 mg via ORAL
  Filled 2017-03-19 (×4): qty 1

## 2017-03-19 MED ORDER — METOCLOPRAMIDE HCL 5 MG/ML IJ SOLN: 5.0000 mg | Freq: Three times a day (TID) | INTRAMUSCULAR | Status: DC | PRN

## 2017-03-19 MED ORDER — BUPIVACAINE HCL (PF) 0.25 % IJ SOLN
INTRAMUSCULAR | Status: DC | PRN
  Administered 2017-03-19: 30 mL

## 2017-03-19 MED ORDER — FLEET ENEMA 7-19 GM/118ML RE ENEM: 1.0000 | ENEMA | Freq: Once | RECTAL | Status: DC | PRN

## 2017-03-19 MED ORDER — ALBUTEROL SULFATE HFA 108 (90 BASE) MCG/ACT IN AERS: 2.0000 | INHALATION_SPRAY | RESPIRATORY_TRACT | Status: DC | PRN

## 2017-03-19 MED ORDER — ACETAMINOPHEN 10 MG/ML IV SOLN
1000.0000 mg | Freq: Once | INTRAVENOUS | Status: AC
  Administered 2017-03-19: 1000 mg via INTRAVENOUS

## 2017-03-19 MED ORDER — PHENYLEPHRINE 40 MCG/ML (10ML) SYRINGE FOR IV PUSH (FOR BLOOD PRESSURE SUPPORT)
PREFILLED_SYRINGE | INTRAVENOUS | Status: AC
  Filled 2017-03-19: qty 10

## 2017-03-19 MED ORDER — PROPOFOL 10 MG/ML IV BOLUS
INTRAVENOUS | Status: AC
Start: 2017-03-19 — End: 2017-03-19
  Filled 2017-03-19: qty 20

## 2017-03-19 MED ORDER — DEXAMETHASONE SODIUM PHOSPHATE 10 MG/ML IJ SOLN
INTRAMUSCULAR | Status: AC
  Filled 2017-03-19: qty 1

## 2017-03-19 MED ORDER — SODIUM CHLORIDE 0.9 % IV SOLN
1000.0000 mg | INTRAVENOUS | Status: AC
  Administered 2017-03-19: 1000 mg via INTRAVENOUS
  Filled 2017-03-19: qty 1100

## 2017-03-19 MED ORDER — ACETAMINOPHEN 10 MG/ML IV SOLN
INTRAVENOUS | Status: AC
  Filled 2017-03-19: qty 100

## 2017-03-19 MED ORDER — FENTANYL CITRATE (PF) 100 MCG/2ML IJ SOLN
INTRAMUSCULAR | Status: DC | PRN
  Administered 2017-03-19: 100 ug via INTRAVENOUS

## 2017-03-19 MED ORDER — MIDAZOLAM HCL 2 MG/2ML IJ SOLN
INTRAMUSCULAR | Status: AC
  Filled 2017-03-19: qty 2

## 2017-03-19 MED ORDER — PHENYLEPHRINE HCL 10 MG/ML IJ SOLN
INTRAVENOUS | Status: DC | PRN
  Administered 2017-03-19: 25 ug/min via INTRAVENOUS

## 2017-03-19 MED ORDER — 0.9 % SODIUM CHLORIDE (POUR BTL) OPTIME
TOPICAL | Status: DC | PRN
  Administered 2017-03-19: 1000 mL

## 2017-03-19 MED ORDER — BISACODYL 10 MG RE SUPP: 10.0000 mg | Freq: Every day | RECTAL | Status: DC | PRN

## 2017-03-19 MED ORDER — PROMETHAZINE HCL 25 MG/ML IJ SOLN: 6.2500 mg | INTRAMUSCULAR | Status: DC | PRN

## 2017-03-19 MED ORDER — TRAMADOL HCL 50 MG PO TABS
50.0000 mg | ORAL_TABLET | Freq: Four times a day (QID) | ORAL | Status: DC | PRN
  Administered 2017-03-20: 100 mg via ORAL
  Filled 2017-03-19: qty 2

## 2017-03-19 MED ORDER — ONDANSETRON HCL 4 MG PO TABS: 4.0000 mg | ORAL_TABLET | Freq: Four times a day (QID) | ORAL | Status: DC | PRN

## 2017-03-19 MED ORDER — MEPERIDINE HCL 50 MG/ML IJ SOLN: 6.2500 mg | INTRAMUSCULAR | Status: DC | PRN

## 2017-03-19 MED ORDER — PHENYLEPHRINE 40 MCG/ML (10ML) SYRINGE FOR IV PUSH (FOR BLOOD PRESSURE SUPPORT)
PREFILLED_SYRINGE | INTRAVENOUS | Status: DC | PRN
  Administered 2017-03-19 (×5): 80 ug via INTRAVENOUS

## 2017-03-19 MED ORDER — LACTATED RINGERS IV SOLN: INTRAVENOUS | Status: DC

## 2017-03-19 MED ORDER — DIPHENHYDRAMINE HCL 12.5 MG/5ML PO ELIX
12.5000 mg | ORAL_SOLUTION | ORAL | Status: DC | PRN
  Administered 2017-03-20: 12.5 mg via ORAL

## 2017-03-19 MED ORDER — CEFAZOLIN SODIUM-DEXTROSE 2-4 GM/100ML-% IV SOLN
2.0000 g | Freq: Four times a day (QID) | INTRAVENOUS | Status: AC
  Administered 2017-03-19 – 2017-03-20 (×2): 2 g via INTRAVENOUS
  Filled 2017-03-19 (×2): qty 100

## 2017-03-19 MED ORDER — ACETAMINOPHEN 500 MG PO TABS
1000.0000 mg | ORAL_TABLET | Freq: Four times a day (QID) | ORAL | Status: AC
  Administered 2017-03-20 (×3): 1000 mg via ORAL
  Filled 2017-03-19 (×4): qty 2

## 2017-03-19 MED ORDER — ONDANSETRON HCL 4 MG/2ML IJ SOLN
INTRAMUSCULAR | Status: AC
  Filled 2017-03-19: qty 2

## 2017-03-19 MED ORDER — METOCLOPRAMIDE HCL 5 MG PO TABS: 5.0000 mg | ORAL_TABLET | Freq: Three times a day (TID) | ORAL | Status: DC | PRN

## 2017-03-19 MED ORDER — CEFAZOLIN SODIUM-DEXTROSE 2-4 GM/100ML-% IV SOLN
2.0000 g | INTRAVENOUS | Status: AC
  Administered 2017-03-19: 2 g via INTRAVENOUS

## 2017-03-19 MED ORDER — HYDROMORPHONE HCL 2 MG/ML IJ SOLN
INTRAMUSCULAR | Status: AC
  Administered 2017-03-19: 0.5 mg via INTRAVENOUS
  Filled 2017-03-19: qty 1

## 2017-03-19 MED ORDER — STERILE WATER FOR IRRIGATION IR SOLN
Status: DC | PRN
  Administered 2017-03-19: 2000 mL

## 2017-03-19 MED ORDER — PROPOFOL 10 MG/ML IV BOLUS
INTRAVENOUS | Status: AC
  Filled 2017-03-19: qty 40

## 2017-03-19 MED ORDER — ONDANSETRON HCL 4 MG/2ML IJ SOLN: 4.0000 mg | Freq: Four times a day (QID) | INTRAMUSCULAR | Status: DC | PRN

## 2017-03-19 MED ORDER — POLYETHYLENE GLYCOL 3350 17 G PO PACK: 17.0000 g | PACK | Freq: Every day | ORAL | Status: DC | PRN

## 2017-03-19 MED ORDER — ACETAMINOPHEN 325 MG PO TABS: 650.0000 mg | ORAL_TABLET | Freq: Four times a day (QID) | ORAL | Status: DC | PRN

## 2017-03-19 MED ORDER — ALBUMIN HUMAN 5 % IV SOLN
INTRAVENOUS | Status: AC
  Filled 2017-03-19: qty 250

## 2017-03-19 MED ORDER — BUPIVACAINE HCL (PF) 0.5 % IJ SOLN
INTRAMUSCULAR | Status: AC
  Filled 2017-03-19: qty 30

## 2017-03-19 MED ORDER — PHENYLEPHRINE HCL 10 MG/ML IJ SOLN: 30.0000 ug/min | INTRAMUSCULAR | Status: DC

## 2017-03-19 MED ORDER — FENTANYL CITRATE (PF) 100 MCG/2ML IJ SOLN
INTRAMUSCULAR | Status: AC
  Filled 2017-03-19: qty 2

## 2017-03-19 MED ORDER — ONDANSETRON HCL 4 MG/2ML IJ SOLN
INTRAMUSCULAR | Status: DC | PRN
Start: 2017-03-19 — End: 2017-03-19
  Administered 2017-03-19: 4 mg via INTRAVENOUS

## 2017-03-19 MED ORDER — SODIUM CHLORIDE 0.9 % IV SOLN
INTRAVENOUS | Status: DC
  Administered 2017-03-19: 18:00:00 via INTRAVENOUS

## 2017-03-19 MED ORDER — PHENOL 1.4 % MT LIQD: 1.0000 | OROMUCOSAL | Status: DC | PRN

## 2017-03-19 MED ORDER — TRANEXAMIC ACID 1000 MG/10ML IV SOLN
1000.0000 mg | Freq: Once | INTRAVENOUS | Status: AC
  Administered 2017-03-19: 1000 mg via INTRAVENOUS
  Filled 2017-03-19: qty 1100

## 2017-03-19 MED ORDER — MIDAZOLAM HCL 5 MG/5ML IJ SOLN
INTRAMUSCULAR | Status: DC | PRN
  Administered 2017-03-19: 2 mg via INTRAVENOUS

## 2017-03-19 MED ORDER — PROPOFOL 500 MG/50ML IV EMUL
INTRAVENOUS | Status: DC | PRN
  Administered 2017-03-19: 100 ug/kg/min via INTRAVENOUS

## 2017-03-19 MED ORDER — ALBUMIN HUMAN 5 % IV SOLN
INTRAVENOUS | Status: AC
  Administered 2017-03-19: 12.5 g
  Filled 2017-03-19: qty 250

## 2017-03-19 MED ORDER — RIVAROXABAN 10 MG PO TABS
10.0000 mg | ORAL_TABLET | Freq: Every day | ORAL | Status: DC
  Administered 2017-03-20 – 2017-03-21 (×2): 10 mg via ORAL
  Filled 2017-03-19 (×2): qty 1

## 2017-03-19 SURGICAL SUPPLY — 36 items
BAG DECANTER FOR FLEXI CONT (MISCELLANEOUS) ×1 IMPLANT
BAG SPEC THK2 15X12 ZIP CLS (MISCELLANEOUS)
BAG ZIPLOCK 12X15 (MISCELLANEOUS) IMPLANT
BLADE SAG 18X100X1.27 (BLADE) ×3 IMPLANT
CAPT HIP TOTAL 2 ×2 IMPLANT
CLOSURE WOUND 1/2 X4 (GAUZE/BANDAGES/DRESSINGS) ×1
CLOTH BEACON ORANGE TIMEOUT ST (SAFETY) ×3 IMPLANT
COVER PERINEAL POST (MISCELLANEOUS) ×3 IMPLANT
COVER SURGICAL LIGHT HANDLE (MISCELLANEOUS) ×3 IMPLANT
DECANTER SPIKE VIAL GLASS SM (MISCELLANEOUS) ×3 IMPLANT
DRAPE STERI IOBAN 125X83 (DRAPES) ×3 IMPLANT
DRAPE U-SHAPE 47X51 STRL (DRAPES) ×6 IMPLANT
DRSG ADAPTIC 3X8 NADH LF (GAUZE/BANDAGES/DRESSINGS) ×3 IMPLANT
DRSG MEPILEX BORDER 4X4 (GAUZE/BANDAGES/DRESSINGS) ×3 IMPLANT
DRSG MEPILEX BORDER 4X8 (GAUZE/BANDAGES/DRESSINGS) ×3 IMPLANT
DURAPREP 26ML APPLICATOR (WOUND CARE) ×3 IMPLANT
ELECT REM PT RETURN 15FT ADLT (MISCELLANEOUS) ×3 IMPLANT
EVACUATOR 1/8 PVC DRAIN (DRAIN) ×3 IMPLANT
GLOVE BIO SURGEON STRL SZ7.5 (GLOVE) ×3 IMPLANT
GLOVE BIO SURGEON STRL SZ8 (GLOVE) ×4 IMPLANT
GLOVE BIOGEL PI IND STRL 8 (GLOVE) ×2 IMPLANT
GLOVE BIOGEL PI INDICATOR 8 (GLOVE) ×4
GOWN STRL REUS W/TWL LRG LVL3 (GOWN DISPOSABLE) ×3 IMPLANT
GOWN STRL REUS W/TWL XL LVL3 (GOWN DISPOSABLE) ×3 IMPLANT
PACK ANTERIOR HIP CUSTOM (KITS) ×3 IMPLANT
STRIP CLOSURE SKIN 1/2X4 (GAUZE/BANDAGES/DRESSINGS) ×2 IMPLANT
SUT ETHIBOND NAB CT1 #1 30IN (SUTURE) ×3 IMPLANT
SUT MNCRL AB 4-0 PS2 18 (SUTURE) ×3 IMPLANT
SUT STRATAFIX 0 PDS 27 VIOLET (SUTURE) ×6
SUT VIC AB 2-0 CT1 27 (SUTURE) ×6
SUT VIC AB 2-0 CT1 TAPERPNT 27 (SUTURE) ×2 IMPLANT
SUTURE STRATFX 0 PDS 27 VIOLET (SUTURE) ×1 IMPLANT
SYR 50ML LL SCALE MARK (SYRINGE) IMPLANT
TRAY FOLEY CATH 14FRSI W/METER (CATHETERS) ×2 IMPLANT
TRAY FOLEY W/METER SILVER 16FR (SET/KITS/TRAYS/PACK) ×1 IMPLANT
YANKAUER SUCT BULB TIP 10FT TU (MISCELLANEOUS) ×3 IMPLANT

## 2017-03-19 NOTE — Anesthesia Preprocedure Evaluation (Signed)
Anesthesia Evaluation  Patient identified by MRN, date of birth, ID band Patient awake    Reviewed: Allergy & Precautions, NPO status , Patient's Chart, lab work & pertinent test results  Airway Mallampati: I  TM Distance: >3 FB Neck ROM: Full    Dental  (+) Teeth Intact, Dental Advisory Given   Pulmonary asthma , former smoker,    breath sounds clear to auscultation       Cardiovascular + Peripheral Vascular Disease   Rhythm:Regular Rate:Normal     Neuro/Psych negative neurological ROS  negative psych ROS   GI/Hepatic negative GI ROS, Neg liver ROS,   Endo/Other  negative endocrine ROS  Renal/GU negative Renal ROS  negative genitourinary   Musculoskeletal  (+) Arthritis , Osteoarthritis,    Abdominal   Peds negative pediatric ROS (+)  Hematology negative hematology ROS (+)   Anesthesia Other Findings   Reproductive/Obstetrics negative OB ROS                           Lab Results  Component Value Date   WBC 6.1 03/13/2017   HGB 13.1 03/13/2017   HCT 40.7 03/13/2017   MCV 91.9 03/13/2017   PLT 234 03/13/2017   Lab Results  Component Value Date   CREATININE 0.83 03/13/2017   BUN 18 03/13/2017   NA 141 03/13/2017   K 4.4 03/13/2017   CL 106 03/13/2017   CO2 29 03/13/2017   Lab Results  Component Value Date   INR 0.93 03/13/2017     Anesthesia Physical Anesthesia Plan  ASA: II  Anesthesia Plan: Spinal   Post-op Pain Management:    Induction: Intravenous  Airway Management Planned:   Additional Equipment:   Intra-op Plan:   Post-operative Plan:   Informed Consent: I have reviewed the patients History and Physical, chart, labs and discussed the procedure including the risks, benefits and alternatives for the proposed anesthesia with the patient or authorized representative who has indicated his/her understanding and acceptance.   Dental advisory given  Plan  Discussed with: CRNA  Anesthesia Plan Comments:        Anesthesia Quick Evaluation

## 2017-03-19 NOTE — Progress Notes (Signed)
Dr. Hart Rochester states that as long as patient feels fine and is mentating well with no change and the MAP's remain stable he is fine with pt going to floor with this BP

## 2017-03-19 NOTE — Interval H&P Note (Signed)
History and Physical Interval Note:  03/19/2017 11:56 AM  Michele Thornton  has presented today for surgery, with the diagnosis of Osteoarthritis Right hip   The various methods of treatment have been discussed with the patient and family. After consideration of risks, benefits and other options for treatment, the patient has consented to  Procedure(s) with comments: RIGHT TOTAL HIP ARTHROPLASTY ANTERIOR APPROACH (Right) - requests as a surgical intervention .  The patient's history has been reviewed, patient examined, no change in status, stable for surgery.  I have reviewed the patient's chart and labs.  Questions were answered to the patient's satisfaction.     Loanne Drilling

## 2017-03-19 NOTE — Anesthesia Postprocedure Evaluation (Addendum)
Anesthesia Post Note  Patient: Karista Aispuro  Procedure(s) Performed: Procedure(s) (LRB): RIGHT TOTAL HIP ARTHROPLASTY ANTERIOR APPROACH (Right)  Patient location during evaluation: PACU Anesthesia Type: Spinal Level of consciousness: oriented and awake and alert Pain management: pain level controlled Vital Signs Assessment: post-procedure vital signs reviewed and stable Respiratory status: spontaneous breathing, respiratory function stable and patient connected to nasal cannula oxygen Cardiovascular status: blood pressure returned to baseline and stable Postop Assessment: no headache, no backache and spinal receding Anesthetic complications: no       Last Vitals:  Vitals:   03/19/17 1800 03/19/17 1900  BP: 102/67 (!) 105/58  Pulse: 77 88  Resp: 14 15  Temp: 36.6 C 36.7 C    Last Pain:  Vitals:   03/19/17 1900  TempSrc: Oral  PainSc:                  Shelton Silvas

## 2017-03-19 NOTE — Anesthesia Procedure Notes (Signed)
Spinal  Patient location during procedure: OR Start time: 03/19/2017 1:35 PM End time: 03/19/2017 1:37 PM Staffing Resident/CRNA: ALDAY, STEPHEN R Performed: resident/CRNA  Preanesthetic Checklist Completed: patient identified, site marked, surgical consent, pre-op evaluation, timeout performed, IV checked, risks and benefits discussed and monitors and equipment checked Spinal Block Patient position: sitting Prep: Betadine Patient monitoring: heart rate, cardiac monitor, continuous pulse ox and blood pressure Approach: midline Location: L3-4 Injection technique: single-shot Needle Needle type: Pencan  Needle gauge: 24 G Needle length: 10 cm Needle insertion depth: 7 cm Assessment Sensory level: T6 Additional Notes Timeout performed. SAB kit date checked. SAB without difficulty     

## 2017-03-19 NOTE — H&P (View-Only) (Signed)
Michele Thornton DOB: 03/14/1951 Married / Language: English / Race: White Female Date of Admission:  03/19/2017 CC:  Right Hip pain History of Present Illness The patient is a 65 year old female who comes in for a preoperative History and Physical. The patient is scheduled for a right total hip arthroplasty (anterior) to be performed by Dr. Gus Rankin. Aluisio, MD at [facility] on 03-19-2017. The patient is a 66 year old female who presented for follow up of their hip. The patient is being followed for their right hip pain and osteoarthritis. They are month(s) out from IA injection. Symptoms reported include: pain (soreness) and instability (limping). The patient feels that they are doing poorly and report their pain level to be moderate (constant). The following medication has been used for pain control: antiinflammatory medication (Ibuprofen and Celebrex). The patient has not gotten any relief of their symptoms with Cortisone injections (helped "some" increased throbbing but did help w/ getting up out of a chair/seated position.). She had the intraarticular injection to her right hip about a month ago. It did provide some benefit, but she is still having discomfort. Over the past year or so the hip has gotten much worse. She has pain with all activities. Generally she does have discomfort at rest. She is able to sleep at night. She is at a stage where she is having a lot of functional limitations with regards to the hip and wants to proceed with surgery at this time. They have been treated conservatively in the past for the above stated problem and despite conservative measures, they continue to have progressive pain and severe functional limitations and dysfunction. They have failed non-operative management including home exercise, medications, and injections. It is felt that they would benefit from undergoing total joint replacement. Risks and benefits of the procedure have been discussed with the  patient and they elect to proceed with surgery. There are no active contraindications to surgery such as ongoing infection or rapidly progressive neurological disease.   Problem List/Past Medical  Right hip pain (M25.551)  Primary osteoarthritis of right hip (M16.11)  Asthma  Skin Cancer  Impaired Vision  Hypercholesterolemia  Cataract  Varicose veins  Hemorrhoids  Menopause   Allergies  Sulfa Antibiotics   Family History Cerebrovascular Accident  Maternal Grandfather. Diabetes Mellitus  Paternal Grandmother. Osteoporosis  Mother. Rheumatoid Arthritis  Mother.  Social History  Current drinker  09/18/2016: Currently drinks wine and hard liquor only occasionally per week Current work status  working full time Exercise  Exercises rarely; does other Living situation  live with spouse Marital status  married No history of drug/alcohol rehab  Not under pain contract  Number of flights of stairs before winded  less than 1 Tobacco / smoke exposure  09/18/2016: no Tobacco use  Former smoker. 09/18/2016: smoke(d) 1 pack(s) per day  Medication History  Celecoxib (  Capsule, Oral) Active. Tylenol (  Tablet, Oral) Active. Equate Allergy-Sinus Headache (12.5-30-500MG  Tablet, Oral) Active. Symbicort (160-4.5MCG/ACT Aerosol, Inhalation) Active.  Past Surgical History  Dilation and Curettage of Uterus    Review of Systems  General Present- Night Sweats (postmenopausal). Not Present- Chills, Fatigue, Fever, Memory Loss, Weight Gain and Weight Loss. Skin Not Present- Eczema, Hives, Itching, Lesions and Rash. HEENT Not Present- Dentures, Double Vision, Headache, Hearing Loss, Tinnitus and Visual Loss. Respiratory Not Present- Allergies, Chronic Cough, Coughing up blood, Shortness of breath at rest and Shortness of breath with exertion. Cardiovascular Not Present- Chest Pain, Difficulty Breathing Lying Down, Murmur,  Palpitations, Racing/skipping  heartbeats and Swelling. Gastrointestinal Not Present- Abdominal Pain, Bloody Stool, Constipation, Diarrhea, Difficulty Swallowing, Heartburn, Jaundice, Loss of appetitie, Nausea and Vomiting. Female Genitourinary Not Present- Blood in Urine, Discharge, Flank Pain, Incontinence, Painful Urination, Urgency, Urinary frequency, Urinary Retention, Urinating at Night and Weak urinary stream. Musculoskeletal Not Present- Back Pain, Joint Pain, Joint Swelling, Morning Stiffness, Muscle Pain, Muscle Weakness and Spasms. Neurological Not Present- Blackout spells, Difficulty with balance, Dizziness, Paralysis, Tremor and Weakness. Psychiatric Not Present- Insomnia.  Vitals  Age 65 Pulse: 80 (Regular)  BP: 118/68 (Sitting, Left Arm, Standard)     Physical Exam General Mental Status -Alert, cooperative and good historian. General Appearance-pleasant, Not in acute distress. Orientation-Oriented X3. Build & Nutrition-Well nourished and Well developed.  Head and Neck Head-normocephalic, atraumatic . Neck Global Assessment - supple, no bruit auscultated on the right, no bruit auscultated on the left.  Eye Vision-Wears corrective lenses. Pupil - Bilateral-Regular and Round. Motion - Bilateral-EOMI.  Chest and Lung Exam Auscultation Breath sounds - clear at anterior chest wall and clear at posterior chest wall. Adventitious sounds - No Adventitious sounds.  Cardiovascular Auscultation Rhythm - Regular rate and rhythm. Heart Sounds - S1 WNL and S2 WNL. Murmurs & Other Heart Sounds - Auscultation of the heart reveals - No Murmurs.  Abdomen Palpation/Percussion Tenderness - Abdomen is non-tender to palpation. Rigidity (guarding) - Abdomen is soft. Auscultation Auscultation of the abdomen reveals - Bowel sounds normal.  Female Genitourinary Note: Not done, not pertinent to present illness   Musculoskeletal Note: On exam, well-developed female, alert and oriented, in  no apparent distress. Left hip has normal motion, no discomfort. Right hip flexion 100, no internal rotation, about 10 to 20 of external rotation, 20 of abduction. She has significantly antalgic gait pattern on the right.  Her radiographs are reviewed. AP pelvis, lateral of the right hip. She is essentially bone-on-bone in the right hip with subchondral cystic formation.   Assessment & Plan Primary osteoarthritis of right hip (M16.11)  Note:Surgical Plans: Right Total Hip Replacement - Anterior Approach  Disposition: Home, HHPT  PCP: Dr. Brad Thomas - Patient has been seen preoperatively and felt to be stable for surgery.  IV TXA  Anesthesia Issues: None  Patient was instructed on what medications to stop prior to surgery.  Signed electronically by Samaia Iwata L Rashon Rezek, III PA-C 

## 2017-03-19 NOTE — Op Note (Signed)
OPERATIVE REPORT- TOTAL HIP ARTHROPLASTY   PREOPERATIVE DIAGNOSIS: Osteoarthritis of the Right hip.   POSTOPERATIVE DIAGNOSIS: Osteoarthritis of the Right  hip.   PROCEDURE: Right total hip arthroplasty, anterior approach.   SURGEON: Ollen Gross, MD   ASSISTANT: Avel Peace, PA-C  ANESTHESIA:  Spinal  ESTIMATED BLOOD LOSS:- 650 ml  DRAINS: Hemovac x1.   COMPLICATIONS: None   CONDITION: PACU - hemodynamically stable.   BRIEF CLINICAL NOTE: Michele Thornton is a 66 y.o. female who has advanced end-  stage arthritis of their Right  hip with progressively worsening pain and  dysfunction.The patient has failed nonoperative management and presents for  total hip arthroplasty.   PROCEDURE IN DETAIL: After successful administration of spinal  anesthetic, the traction boots for the [facility] bed were placed on both  feet and the patient was placed onto the [facility] bed, boots placed into the leg  holders. The Right hip was then isolated from the perineum with plastic  drapes and prepped and draped in the usual sterile fashion. ASIS and  greater trochanter were marked and a oblique incision was made, starting  at about 1 cm lateral and 2 cm distal to the ASIS and coursing towards  the anterior cortex of the femur. The skin was cut with a 10 blade  through subcutaneous tissue to the level of the fascia overlying the  tensor fascia lata muscle. The fascia was then incised in line with the  incision at the junction of the anterior third and posterior 2/3rd. The  muscle was teased off the fascia and then the interval between the TFL  and the rectus was developed. The Hohmann retractor was then placed at  the top of the femoral neck over the capsule. The vessels overlying the  capsule were cauterized and the fat on top of the capsule was removed.  A Hohmann retractor was then placed anterior underneath the rectus  femoris to give exposure to the entire anterior capsule. A T-shaped   capsulotomy was performed. The edges were tagged and the femoral head  was identified.       Osteophytes are removed off the superior acetabulum.  The femoral neck was then cut in situ with an oscillating saw. Traction  was then applied to the left lower extremity utilizing the [facility]  traction. The femoral head was then removed. Retractors were placed  around the acetabulum and then circumferential removal of the labrum was  performed. Osteophytes were also removed. Reaming starts at 43 mm to  medialize and  Increased in 2 mm increments to 47 mm. We reamed in  approximately 40 degrees of abduction, 20 degrees anteversion. A 48 mm  pinnacle acetabular shell was then impacted in anatomic position under  fluoroscopic guidance with excellent purchase. We did not need to place  any additional dome screws. A 28 mm neutral + 4 marathon liner was then  placed into the acetabular shell.       The femoral lift was then placed along the lateral aspect of the femur  just distal to the vastus ridge. The leg was  externally rotated and capsule  was stripped off the inferior aspect of the femoral neck down to the  level of the lesser trochanter, this was done with electrocautery. The femur was lifted after this was performed. The  leg was then placed in an extended and adducted position essentially delivering the femur. We also removed the capsule superiorly and the piriformis from the piriformis fossa to  gain excellent exposure of the  proximal femur. Rongeur was used to remove some cancellous bone to get  into the lateral portion of the proximal femur for placement of the  initial starter reamer. The starter broaches was placed  the starter broach  and was shown to go down the center of the canal. Broaching  with the  Corail system was then performed starting at size 8, coursing  Up to size 10. A size 10 had excellent torsional and rotational  and axial stability. The trial high offset neck was then  placed  with a 28 + 1.5 trial head. The hip was then reduced. We confirmed that  the stem was in the canal both on AP and lateral x-rays. It also has excellent sizing. The hip was reduced with outstanding stability through full extension and full external rotation.. AP pelvis was taken and the leg lengths were measured and found to be equal. Hip was then dislocated again and the femoral head and neck removed. The  femoral broach was removed. Size 10 Corail stem with a high offset  neck was then impacted into the femur following native anteversion. Has  excellent purchase in the canal. Excellent torsional and rotational and  axial stability. It is confirmed to be in the canal on AP and lateral  fluoroscopic views. The 28 + 1.5 ceramic head was placed and the hip  reduced with outstanding stability. Again AP pelvis was taken and it  confirmed that the leg lengths were equal. The wound was then copiously  irrigated with saline solution and the capsule reattached and repaired  with Ethibond suture. 30 ml of .25% Bupivicaine was  injected into the capsule and into the edge of the tensor fascia lata as well as subcutaneous tissue. The fascia overlying the tensor fascia lata was then closed with a running #1 V-Loc. Subcu was closed with interrupted 2-0 Vicryl and subcuticular running 4-0 Monocryl. Incision was cleaned  and dried. Steri-Strips and a bulky sterile dressing applied. Hemovac  drain was hooked to suction and then the patient was awakened and transported to  recovery in stable condition.        Please note that a surgical assistant was a medical necessity for this procedure to perform it in a safe and expeditious manner. Assistant was necessary to provide appropriate retraction of vital neurovascular structures and to prevent femoral fracture and allow for anatomic placement of the prosthesis.  Ollen Gross, M.D.

## 2017-03-19 NOTE — Transfer of Care (Signed)
Immediate Anesthesia Transfer of Care Note  Patient: Shandee Jergens  Procedure(s) Performed: Procedure(s) with comments: RIGHT TOTAL HIP ARTHROPLASTY ANTERIOR APPROACH (Right) - requests  Patient Location: PACU  Anesthesia Type:Spinal  Level of Consciousness: awake, alert , oriented and patient cooperative  Airway & Oxygen Therapy: Patient Spontanous Breathing and Patient connected to face mask oxygen  Post-op Assessment: Report given to RN, Post -op Vital signs reviewed and stable and Neo gtt left infusing at approx. 50 mcg/min  Post vital signs: Reviewed  Last Vitals:  Vitals:   03/19/17 1149 03/19/17 1525  BP: 107/79 (!) 74/56  Pulse: 77 90  Resp: 16 14  Temp:  (P) 36.4 C    Last Pain:  Vitals:   03/19/17 1153  TempSrc:   PainSc: 4       Patients Stated Pain Goal: 4 (03/19/17 1153)  Complications: No apparent anesthesia complications

## 2017-03-20 ENCOUNTER — Encounter (HOSPITAL_COMMUNITY): Payer: Self-pay | Admitting: Orthopedic Surgery

## 2017-03-20 LAB — BASIC METABOLIC PANEL
Anion gap: 6 (ref 5–15)
BUN: 12 mg/dL (ref 6–20)
CHLORIDE: 106 mmol/L (ref 101–111)
CO2: 28 mmol/L (ref 22–32)
CREATININE: 0.82 mg/dL (ref 0.44–1.00)
Calcium: 8.6 mg/dL — ABNORMAL LOW (ref 8.9–10.3)
Glucose, Bld: 213 mg/dL — ABNORMAL HIGH (ref 65–99)
Potassium: 4.1 mmol/L (ref 3.5–5.1)
SODIUM: 140 mmol/L (ref 135–145)

## 2017-03-20 LAB — CBC
HCT: 28.4 % — ABNORMAL LOW (ref 36.0–46.0)
HEMOGLOBIN: 9.4 g/dL — AB (ref 12.0–15.0)
MCH: 30.9 pg (ref 26.0–34.0)
MCHC: 33.1 g/dL (ref 30.0–36.0)
MCV: 93.4 fL (ref 78.0–100.0)
PLATELETS: 191 10*3/uL (ref 150–400)
RBC: 3.04 MIL/uL — AB (ref 3.87–5.11)
RDW: 13.3 % (ref 11.5–15.5)
WBC: 10.8 10*3/uL — ABNORMAL HIGH (ref 4.0–10.5)

## 2017-03-20 MED ORDER — POLYSACCHARIDE IRON COMPLEX 150 MG PO CAPS
150.0000 mg | ORAL_CAPSULE | Freq: Every day | ORAL | Status: DC
  Administered 2017-03-20: 11:00:00 150 mg via ORAL
  Filled 2017-03-20: qty 1

## 2017-03-20 MED ORDER — METHOCARBAMOL 500 MG PO TABS: 500.0000 mg | ORAL_TABLET | Freq: Four times a day (QID) | ORAL | 0 refills | Status: DC | PRN

## 2017-03-20 MED ORDER — POLYSACCHARIDE IRON COMPLEX 150 MG PO CAPS: 150.0000 mg | ORAL_CAPSULE | Freq: Every day | ORAL | 0 refills | Status: DC

## 2017-03-20 MED ORDER — SODIUM CHLORIDE 0.9 % IV BOLUS (SEPSIS)
250.0000 mL | Freq: Once | INTRAVENOUS | Status: AC
  Administered 2017-03-20: 250 mL via INTRAVENOUS

## 2017-03-20 MED ORDER — OXYCODONE HCL 5 MG PO TABS: 5.0000 mg | ORAL_TABLET | ORAL | 0 refills | Status: DC | PRN

## 2017-03-20 MED ORDER — RIVAROXABAN 10 MG PO TABS: 10.0000 mg | ORAL_TABLET | Freq: Every day | ORAL | 0 refills | Status: DC

## 2017-03-20 MED ORDER — TRAMADOL HCL 50 MG PO TABS: 50.0000 mg | ORAL_TABLET | Freq: Four times a day (QID) | ORAL | 0 refills | Status: DC | PRN

## 2017-03-20 NOTE — Discharge Summary (Signed)
Physician Discharge Summary   Patient ID: Michele Thornton MRN: [id number] DOB/AGE: September 10, 1951 66 y.o.  Admit date: 03/19/2017 Discharge date: 03-21-2017  Primary Diagnosis:  Osteoarthritis of the Right hip.   Admission Diagnoses:  Past Medical History:  Diagnosis Date  . Allergy   . Asthma   . Incisional hernia   . Pneumonia   . Umbilical hernia    childhood  . Varicose veins    Discharge Diagnoses:   Principal Problem:   OA (osteoarthritis) of hip  Estimated body mass index is 32.9 kg/m as calculated from the following:   Height as of this encounter: 5' 1.5" (1.562 m).   Weight as of this encounter: 80.3 kg (177 lb).  Procedure(s) (LRB): RIGHT TOTAL HIP ARTHROPLASTY ANTERIOR APPROACH (Right)   Consults: None  HPI: Michele Thornton is a 66 y.o. female who has advanced end-  stage arthritis of their Right  hip with progressively worsening pain and  dysfunction.The patient has failed nonoperative management and presents for  total hip arthroplasty.   Laboratory Data: Admission on 03/19/2017  Component Date Value Ref Range Status  . WBC 03/20/2017 10.8* 4.0 - 10.5 K/uL Final  . RBC 03/20/2017 3.04* 3.87 - 5.11 MIL/uL Final  . Hemoglobin 03/20/2017 9.4* 12.0 - 15.0 g/dL Final  . HCT 03/20/2017 28.4* 36.0 - 46.0 % Final  . MCV 03/20/2017 93.4  78.0 - 100.0 fL Final  . MCH 03/20/2017 30.9  26.0 - 34.0 pg Final  . MCHC 03/20/2017 33.1  30.0 - 36.0 g/dL Final  . RDW 03/20/2017 13.3  11.5 - 15.5 % Final  . Platelets 03/20/2017 191  150 - 400 K/uL Final  . Sodium 03/20/2017 140  135 - 145 mmol/L Final  . Potassium 03/20/2017 4.1  3.5 - 5.1 mmol/L Final  . Chloride 03/20/2017 106  101 - 111 mmol/L Final  . CO2 03/20/2017 28  22 - 32 mmol/L Final  . Glucose, Bld 03/20/2017 213* 65 - 99 mg/dL Final  . BUN 03/20/2017 12  6 - 20 mg/dL Final  . Creatinine, Ser 03/20/2017 0.82  0.44 - 1.00 mg/dL Final  . Calcium 03/20/2017 8.6* 8.9 - 10.3 mg/dL Final  . GFR calc non Af Amer 03/20/2017  >60  >60 mL/min Final  . GFR calc Af Amer 03/20/2017 >60  >60 mL/min Final   Comment: (NOTE) The eGFR has been calculated using the CKD EPI equation. This calculation has not been validated in all clinical situations. eGFR's persistently <60 mL/min signify possible Chronic Kidney Disease.   Georgiann Hahn gap 03/20/2017 6  5 - 15 Final  Hospital Outpatient Visit on 03/13/2017  Component Date Value Ref Range Status  . aPTT 03/13/2017 28  24 - 36 seconds Final  . WBC 03/13/2017 6.1  4.0 - 10.5 K/uL Final  . RBC 03/13/2017 4.43  3.87 - 5.11 MIL/uL Final  . Hemoglobin 03/13/2017 13.1  12.0 - 15.0 g/dL Final  . HCT 03/13/2017 40.7  36.0 - 46.0 % Final  . MCV 03/13/2017 91.9  78.0 - 100.0 fL Final  . MCH 03/13/2017 29.6  26.0 - 34.0 pg Final  . MCHC 03/13/2017 32.2  30.0 - 36.0 g/dL Final  . RDW 03/13/2017 13.3  11.5 - 15.5 % Final  . Platelets 03/13/2017 234  150 - 400 K/uL Final  . Sodium 03/13/2017 141  135 - 145 mmol/L Final  . Potassium 03/13/2017 4.4  3.5 - 5.1 mmol/L Final  . Chloride 03/13/2017 106  101 - 111 mmol/L Final  .  CO2 03/13/2017 29  22 - 32 mmol/L Final  . Glucose, Bld 03/13/2017 93  65 - 99 mg/dL Final  . BUN 03/13/2017 18  6 - 20 mg/dL Final  . Creatinine, Ser 03/13/2017 0.83  0.44 - 1.00 mg/dL Final  . Calcium 03/13/2017 9.1  8.9 - 10.3 mg/dL Final  . Total Protein 03/13/2017 7.2  6.5 - 8.1 g/dL Final  . Albumin 03/13/2017 4.1  3.5 - 5.0 g/dL Final  . AST 03/13/2017 30  15 - 41 U/L Final  . ALT 03/13/2017 30  14 - 54 U/L Final  . Alkaline Phosphatase 03/13/2017 116  38 - 126 U/L Final  . Total Bilirubin 03/13/2017 0.9  0.3 - 1.2 mg/dL Final  . GFR calc non Af Amer 03/13/2017 >60  >60 mL/min Final  . GFR calc Af Amer 03/13/2017 >60  >60 mL/min Final   Comment: (NOTE) The eGFR has been calculated using the CKD EPI equation. This calculation has not been validated in all clinical situations. eGFR's persistently <60 mL/min signify possible Chronic Kidney Disease.   .  Anion gap 03/13/2017 6  5 - 15 Final  . Prothrombin Time 03/13/2017 12.5  11.4 - 15.2 seconds Final  . INR 03/13/2017 0.93   Final  . ABO/RH(D) 03/13/2017 O POS   Final  . Antibody Screen 03/13/2017 NEG   Final  . Sample Expiration 03/13/2017 03/22/2017   Final  . Extend sample reason 03/13/2017 NO TRANSFUSIONS OR PREGNANCY IN THE PAST 3 MONTHS   Final  . ABO/RH(D) 03/13/2017 O POS   Final     X-Rays:Dg Pelvis Portable  Result Date: 03/19/2017 CLINICAL DATA:  Status post right hip arthroplasty EXAM: PORTABLE PELVIS 1-2 VIEWS COMPARISON:  None in PACs FINDINGS: The patient has undergone placement of a right hip joint prosthesis. Radiographic positioning of the prosthetic components is good. A surgical drain line is present. IMPRESSION: There is no immediate postprocedure complication following right hip joint prosthesis placement. Electronically Signed   By: David  Martinique M.D.   On: 03/19/2017 16:20   Dg C-arm 1-60 Min-no Report  Result Date: 03/19/2017 Fluoroscopy was utilized by the requesting physician.  No radiographic interpretation.    EKG:No orders found for this or any previous visit.   Hospital Course: Patient was admitted to [facility] and taken to the OR and underwent the above state procedure without complications.  Patient tolerated the procedure well and was later transferred to the recovery room and then to the orthopaedic floor for postoperative care.  They were given PO and IV analgesics for pain control following their surgery.  They were given 24 hours of postoperative antibiotics of  Anti-infectives    Start     Dose/Rate Route Frequency Ordered Stop   03/19/17 2000  ceFAZolin (ANCEF) IVPB 2g/100 mL premix     2 g 200 mL/hr over 30 Minutes Intravenous Every 6 hours 03/19/17 1806 03/20/17 0254   03/19/17 1130  ceFAZolin (ANCEF) IVPB 2g/100 mL premix     2 g 200 mL/hr over 30 Minutes Intravenous On call to O.R. 03/19/17 1130 03/19/17 1340   03/19/17 1130   ceFAZolin (ANCEF) 2-4 GM/100ML-% IVPB    Comments:  Bridget Hartshorn   : cabinet override      03/19/17 1130 03/19/17 1340     and started on DVT prophylaxis in the form of Xarelto.   PT and OT were ordered for total hip protocol.  The patient was allowed to be WBAT with therapy. Discharge  planning was consulted to help with postop disposition and equipment needs.  Patient had a tough night on the evening of surgery.  They started to get up OOB with therapy on day one.  Hemovac drain was pulled without difficulty.  Continued to work with therapy into day two.  Dressing was changed on day two and the incision was healing well. Patient was seen in rounds on POD 2 by Dr. Wynelle Link and was ready to go home.  Discharge home - straight to outpatient Diet - Regular diet Follow up - in 2 weeks Activity - WBAT Disposition - Home Condition Upon Discharge - Good D/C Meds - See DC Summary DVT Prophylaxis - Xarelto   Discharge Instructions    Call MD / Call 911    Complete by:  As directed    If you experience chest pain or shortness of breath, CALL 911 and be transported to the hospital emergency room.  If you develope a fever above 101 F, pus (white drainage) or increased drainage or redness at the wound, or calf pain, call your surgeon's office.   Change dressing    Complete by:  As directed    You may change your dressing dressing daily with sterile 4 x 4 inch gauze dressing and paper tape.  Do not submerge the incision under water.   Constipation Prevention    Complete by:  As directed    Drink plenty of fluids.  Prune juice may be helpful.  You may use a stool softener, such as Colace (over the counter) 100 mg twice a day.  Use MiraLax (over the counter) for constipation as needed.   Diet - low sodium heart healthy    Complete by:  As directed    Discharge instructions    Complete by:  As directed    Pick up stool softner and laxative for home use following surgery while on pain medications. Do  not submerge incision under water. Please use good hand washing techniques while changing dressing each day. May shower starting three days after surgery. Please use a clean towel to pat the incision dry following showers. Continue to use ice for pain and swelling after surgery. Do not use any lotions or creams on the incision until instructed by your surgeon.  Wear both TED hose on both legs during the day every day for three weeks, but may have off at night at home.  Postoperative Constipation Protocol  Constipation - defined medically as fewer than three stools per week and severe constipation as less than one stool per week.  One of the most common issues patients have following surgery is constipation.  Even if you have a regular bowel pattern at home, your normal regimen is likely to be disrupted due to multiple reasons following surgery.  Combination of anesthesia, postoperative narcotics, change in appetite and fluid intake all can affect your bowels.  In order to avoid complications following surgery, here are some recommendations in order to help you during your recovery period.  Colace (docusate) - Pick up an over-the-counter form of Colace or another stool softener and take twice a day as long as you are requiring postoperative pain medications.  Take with a full glass of water daily.  If you experience loose stools or diarrhea, hold the colace until you stool forms back up.  If your symptoms do not get better within 1 week or if they get worse, check with your doctor.  Dulcolax (bisacodyl) - Pick up over-the-counter and take  as directed by the product packaging as needed to assist with the movement of your bowels.  Take with a full glass of water.  Use this product as needed if not relieved by Colace only.   MiraLax (polyethylene glycol) - Pick up over-the-counter to have on hand.  MiraLax is a solution that will increase the amount of water in your bowels to assist with bowel  movements.  Take as directed and can mix with a glass of water, juice, soda, coffee, or tea.  Take if you go more than two days without a movement. Do not use MiraLax more than once per day. Call your doctor if you are still constipated or irregular after using this medication for 7 days in a row.  If you continue to have problems with postoperative constipation, please contact the office for further assistance and recommendations.  If you experience "the worst abdominal pain ever" or develop nausea or vomiting, please contact the office immediatly for further recommendations for treatment.   Take Xarelto for two and a half more weeks, then discontinue Xarelto. Once the patient has completed the blood thinner regimen, then take a Baby 81 mg Aspirin daily for three more weeks.    Do not sit on low chairs, stoools or toilet seats, as it may be difficult to get up from low surfaces    Complete by:  As directed    Driving restrictions    Complete by:  As directed    No driving until released by the physician.   Increase activity slowly as tolerated    Complete by:  As directed    Lifting restrictions    Complete by:  As directed    No lifting until released by the physician.   Patient may shower    Complete by:  As directed    You may shower without a dressing once there is no drainage.  Do not wash over the wound.  If drainage remains, do not shower until drainage stops.   TED hose    Complete by:  As directed    Use stockings (TED hose) for 3 weeks on both leg(s).  You may remove them at night for sleeping.   Weight bearing as tolerated    Complete by:  As directed    Laterality:  right   Extremity:  Lower     Allergies as of 03/20/2017      Reactions   Sulfa Antibiotics Other (See Comments)   Unknown--cannot recall reaction      Medication List    STOP taking these medications   celecoxib 200 MG capsule Commonly known as:  CELEBREX     TAKE these medications   acetaminophen  500 MG tablet Commonly known as:  TYLENOL Take 1,000 mg by mouth 3 (three) times daily as needed (for pain).   ALLERGY RELIEF 25 MG tablet Generic drug:  diphenhydrAMINE Take 25 mg by mouth every 4 (four) hours as needed (for allergies.).   budesonide-formoterol 160-4.5 MCG/ACT inhaler Commonly known as:  SYMBICORT Inhale 2 puffs into the lungs 2 (two) times daily.   iron polysaccharides 150 MG capsule Commonly known as:  NIFEREX Take 1 capsule (150 mg total) by mouth daily.   methocarbamol 500 MG tablet Commonly known as:  ROBAXIN Take 1 tablet (500 mg total) by mouth every 6 (six) hours as needed for muscle spasms.   oxyCODONE 5 MG immediate release tablet Commonly known as:  Oxy IR/ROXICODONE Take 1-2 tablets (5-10 mg total) by  mouth every 4 (four) hours as needed for moderate pain or severe pain.   REFRESH OPTIVE ADVANCED 0.5-1-0.5 % Soln Generic drug:  Carboxymeth-Glycerin-Polysorb Place 1 drop into both eyes at bedtime.   rivaroxaban 10 MG Tabs tablet Commonly known as:  XARELTO Take 1 tablet (10 mg total) by mouth daily with breakfast. Take Xarelto for two and a half more weeks following discharge from the hospital, then discontinue Xarelto. Once the patient has completed the blood thinner regimen, then take a Baby 81 mg Aspirin daily for three more weeks. Start taking on:  03/21/2017   traMADol 50 MG tablet Commonly known as:  ULTRAM Take 1-2 tablets (50-100 mg total) by mouth every 6 (six) hours as needed for moderate pain. What changed:  how much to take  reasons to take this   VENTOLIN HFA 108 (90 Base) MCG/ACT inhaler Generic drug:  albuterol Inhale 2 puffs into the lungs every 4 (four) hours as needed for wheezing or shortness of breath.   VISINE-A 0.025-0.3 % ophthalmic solution Generic drug:  naphazoline-pheniramine Place 1 drop into both eyes 4 (four) times daily as needed (for irritation/allergy eyes.).      Follow-up Information    Gearlean Alf, MD. Schedule an appointment as soon as possible for a visit on 04/01/2017.   Specialty:  Orthopedic Surgery Contact information: [address] [place] [address] [phone]           Signed: Arlee Muslim, PA-C Orthopaedic Surgery 03/20/2017, 8:59 AM

## 2017-03-20 NOTE — Discharge Instructions (Addendum)
° °Dr. Frank Aluisio °Total Joint Specialist °[facility] °[address] °[place], [place] [address] °[phone] ° °ANTERIOR APPROACH TOTAL HIP REPLACEMENT POSTOPERATIVE DIRECTIONS ° ° °Hip Rehabilitation, Guidelines Following Surgery  °The results of a hip operation are greatly improved after range of motion and muscle strengthening exercises. Follow all safety measures which are given to protect your hip. If any of these exercises cause increased pain or swelling in your joint, decrease the amount until you are comfortable again. Then slowly increase the exercises. Call your caregiver if you have problems or questions.  ° °HOME CARE INSTRUCTIONS  °Remove items at home which could result in a fall. This includes throw rugs or furniture in walking pathways.  °· ICE to the affected hip every three hours for 30 minutes at a time and then as needed for pain and swelling.  Continue to use ice on the hip for pain and swelling from surgery. You may notice swelling that will progress down to the foot and ankle.  This is normal after surgery.  Elevate the leg when you are not up walking on it.   °· Continue to use the breathing machine which will help keep your temperature down.  It is common for your temperature to cycle up and down following surgery, especially at night when you are not up moving around and exerting yourself.  The breathing machine keeps your lungs expanded and your temperature down. ° ° °DIET °You may resume your previous home diet once your are discharged from the hospital. ° °DRESSING / WOUND CARE / SHOWERING °You may shower 3 days after surgery, but keep the wounds dry during showering.  You may use an occlusive plastic wrap (Press'n Seal for example), NO SOAKING/SUBMERGING IN THE BATHTUB.  If the bandage gets wet, change with a clean dry gauze.  If the incision gets wet, pat the wound dry with a clean towel. °You may start showering once you are discharged home but do not  submerge the incision under water. Just pat the incision dry and apply a dry gauze dressing on daily. °Change the surgical dressing daily and reapply a dry dressing each time. ° °ACTIVITY °Walk with your walker as instructed. °Use walker as long as suggested by your caregivers. °Avoid periods of inactivity such as sitting longer than an hour when not asleep. This helps prevent blood clots.  °You may resume a sexual relationship in one month or when given the OK by your doctor.  °You may return to work once you are cleared by your doctor.  °Do not drive a car for 6 weeks or until released by you surgeon.  °Do not drive while taking narcotics. ° °WEIGHT BEARING °Weight bearing as tolerated with assist device (walker, cane, etc) as directed, use it as long as suggested by your surgeon or therapist, typically at least 4-6 weeks. ° °POSTOPERATIVE CONSTIPATION PROTOCOL °Constipation - defined medically as fewer than three stools per week and severe constipation as less than one stool per week. ° °One of the most common issues patients have following surgery is constipation.  Even if you have a regular bowel pattern at home, your normal regimen is likely to be disrupted due to multiple reasons following surgery.  Combination of anesthesia, postoperative narcotics, change in appetite and fluid intake all can affect your bowels.  In order to avoid complications following surgery, here are some recommendations in order to help you during your recovery period. ° °Colace (docusate) - Pick up an over-the-counter   form of Colace or another stool softener and take twice a day as long as you are requiring postoperative pain medications.  Take with a full glass of water daily.  If you experience loose stools or diarrhea, hold the colace until you stool forms back up.  If your symptoms do not get better within 1 week or if they get worse, check with your doctor. ° °Dulcolax (bisacodyl) - Pick up over-the-counter and take as directed  by the product packaging as needed to assist with the movement of your bowels.  Take with a full glass of water.  Use this product as needed if not relieved by Colace only.  ° °MiraLax (polyethylene glycol) - Pick up over-the-counter to have on hand.  MiraLax is a solution that will increase the amount of water in your bowels to assist with bowel movements.  Take as directed and can mix with a glass of water, juice, soda, coffee, or tea.  Take if you go more than two days without a movement. °Do not use MiraLax more than once per day. Call your doctor if you are still constipated or irregular after using this medication for 7 days in a row. ° °If you continue to have problems with postoperative constipation, please contact the office for further assistance and recommendations.  If you experience "the worst abdominal pain ever" or develop nausea or vomiting, please contact the office immediatly for further recommendations for treatment. ° °ITCHING ° If you experience itching with your medications, try taking only a single pain pill, or even half a pain pill at a time.  You can also use Benadryl over the counter for itching or also to help with sleep.  ° °TED HOSE STOCKINGS °Wear the elastic stockings on both legs for three weeks following surgery during the day but you may remove then at night for sleeping. ° °MEDICATIONS °See your medication summary on the “After Visit Summary” that the nursing staff will review with you prior to discharge.  You may have some home medications which will be placed on hold until you complete the course of blood thinner medication.  It is important for you to complete the blood thinner medication as prescribed by your surgeon.  Continue your approved medications as instructed at time of discharge. ° °PRECAUTIONS °If you experience chest pain or shortness of breath - call 911 immediately for transfer to the hospital emergency department.  °If you develop a fever greater that 101 F,  purulent drainage from wound, increased redness or drainage from wound, foul odor from the wound/dressing, or calf pain - CONTACT YOUR SURGEON.   °                                                °FOLLOW-UP APPOINTMENTS °Make sure you keep all of your appointments after your operation with your surgeon and caregivers. You should call the office at the above phone number and make an appointment for approximately two weeks after the date of your surgery or on the date instructed by your surgeon outlined in the "After Visit Summary". ° °RANGE OF MOTION AND STRENGTHENING EXERCISES  °These exercises are designed to help you keep full movement of your hip joint. Follow your caregiver's or physical therapist's instructions. Perform all exercises about fifteen times, three times per day or as directed. Exercise both hips, even if you   have had only one joint replacement. These exercises can be done on a training (exercise) mat, on the floor, on a table or on a bed. Use whatever works the best and is most comfortable for you. Use music or television while you are exercising so that the exercises are a pleasant break in your day. This will make your life better with the exercises acting as a break in routine you can look forward to.  °Lying on your back, slowly slide your foot toward your buttocks, raising your knee up off the floor. Then slowly slide your foot back down until your leg is straight again.  °Lying on your back spread your legs as far apart as you can without causing discomfort.  °Lying on your side, raise your upper leg and foot straight up from the floor as far as is comfortable. Slowly lower the leg and repeat.  °Lying on your back, tighten up the muscle in the front of your thigh (quadriceps muscles). You can do this by keeping your leg straight and trying to raise your heel off the floor. This helps strengthen the largest muscle supporting your knee.  °Lying on your back, tighten up the muscles of your  buttocks both with the legs straight and with the knee bent at a comfortable angle while keeping your heel on the floor.  ° °IF YOU ARE TRANSFERRED TO A SKILLED REHAB FACILITY °If the patient is transferred to a skilled rehab facility following release from the hospital, a list of the current medications will be sent to the facility for the patient to continue.  When discharged from the skilled rehab facility, please have the facility set up the patient's Home Health Physical Therapy prior to being released. Also, the skilled facility will be responsible for providing the patient with their medications at time of release from the facility to include their pain medication, the muscle relaxants, and their blood thinner medication. If the patient is still at the rehab facility at time of the two week follow up appointment, the skilled rehab facility will also need to assist the patient in arranging follow up appointment in our office and any transportation needs. ° °MAKE SURE YOU:  °Understand these instructions.  °Get help right away if you are not doing well or get worse.  ° ° °Pick up stool softner and laxative for home use following surgery while on pain medications. °Do not submerge incision under water. °Please use good hand washing techniques while changing dressing each day. °May shower starting three days after surgery. °Please use a clean towel to pat the incision dry following showers. °Continue to use ice for pain and swelling after surgery. °Do not use any lotions or creams on the incision until instructed by your surgeon. ° °Take Xarelto for two and a half more weeks following discharge from the hospital, then discontinue Xarelto. °Once the patient has completed the blood thinner regimen, then take a Baby 81 mg Aspirin daily for three more weeks. ° °Information on my medicine - XARELTO® (Rivaroxaban) ° °This medication education was reviewed with me or my healthcare representative as part of my discharge  preparation.  The pharmacist that spoke with me during my hospital stay was:  Edouard Gikas, RPH ° °Why was Xarelto® prescribed for you? °Xarelto® was prescribed for you to reduce the risk of blood clots forming after orthopedic surgery. The medical term for these abnormal blood clots is venous thromboembolism (VTE). ° °What do you need to know about xarelto® ? °Take   your Xarelto® ONCE DAILY at the same time every day. °You may take it either with or without food. ° °If you have difficulty swallowing the tablet whole, you may crush it and mix in applesauce just prior to taking your dose. ° °Take Xarelto® exactly as prescribed by your doctor and DO NOT stop taking Xarelto® without talking to the doctor who prescribed the medication.  Stopping without other VTE prevention medication to take the place of Xarelto® may increase your risk of developing a clot. ° °After discharge, you should have regular check-up appointments with your healthcare provider that is prescribing your Xarelto®.   ° °What do you do if you miss a dose? °If you miss a dose, take it as soon as you remember on the same day then continue your regularly scheduled once daily regimen the next day. Do not take two doses of Xarelto® on the same day.  ° °Important Safety Information °A possible side effect of Xarelto® is bleeding. You should call your healthcare provider right away if you experience any of the following: °? Bleeding from an injury or your nose that does not stop. °? Unusual colored urine (red or dark brown) or unusual colored stools (red or black). °? Unusual bruising for unknown reasons. °? A serious fall or if you hit your head (even if there is no bleeding). ° °Some medicines may interact with Xarelto® and might increase your risk of bleeding while on Xarelto®. To help avoid this, consult your healthcare provider or pharmacist prior to using any new prescription or non-prescription medications, including herbals, vitamins,  non-steroidal anti-inflammatory drugs (NSAIDs) and supplements. ° °This website has more information on Xarelto®: www.xarelto.com. ° ° ° °

## 2017-03-20 NOTE — Evaluation (Signed)
Occupational Therapy Evaluation Patient Details Name: Michele Thornton MRN: [id number] DOB: 27-Nov-1951 Today's Date: 03/20/2017    History of Present Illness Pt s/p R THR   Clinical Impression   This 66 year old female was admitted for the above sx. She was independent with adls prior to admission. She will benefit from continued OT to reinforce education on toilet transfers, further educate on shower transfers and increase endurance for adls. Pt reports that husband will assist with adls as needed    Follow Up Recommendations  Supervision/Assistance - 24 hour    Equipment Recommendations  3 in 1 bedside commode    Recommendations for Other Services       Precautions / Restrictions Precautions Precautions: Fall Restrictions Weight Bearing Restrictions: No Other Position/Activity Restrictions: WBAT      Mobility Bed Mobility Overal bed mobility: Needs Assistance Bed Mobility: Sit to Supine     Supine to sit: Min assist     General bed mobility comments: assist for RLE  Transfers Overall transfer level: Needs assistance Equipment used: Rolling walker (2 wheeled) Transfers: Sit to/from Stand Sit to Stand: Min guard         General transfer comment: cues for UE/LE placement    Balance                                           ADL either performed or assessed with clinical judgement   ADL Overall ADL's : Needs assistance/impaired Eating/Feeding: Independent   Grooming: Set up;Sitting   Upper Body Bathing: Set up;Sitting   Lower Body Bathing: Moderate assistance;Sit to/from stand   Upper Body Dressing : Set up;Sitting   Lower Body Dressing: Maximal assistance;Sit to/from stand   Toilet Transfer: Min guard;BSC;RW;Ambulation   Toileting- Architect and Hygiene: Min guard;Sit to/from stand         General ADL Comments: pt had difficulty advancing RLE when ambulating to bathroom.  Not ready for shower transfer.  Educated on  washing hair at sink if she is unable to step into shower  Educated on Sports administrator for [employer]     Praxis      Pertinent Vitals/Pain Pain Assessment: 0-10 Pain Score: 4  Pain Location: R hip Pain Descriptors / Indicators: Aching;Sore Pain Intervention(s): Limited activity within patient's tolerance;Monitored during session;Premedicated before session;Repositioned     Hand Dominance     Extremity/Trunk Assessment Upper Extremity Assessment Upper Extremity Assessment: Overall WFL for tasks assessed      Cervical / Trunk Assessment Cervical / Trunk Assessment: Normal   Communication Communication Communication: No difficulties;HOH   Cognition Arousal/Alertness: Awake/alert Behavior During Therapy: WFL for tasks assessed/performed Overall Cognitive Status: Within Functional Limits for tasks assessed                                     General Comments       Exercises    Shoulder Instructions      Home Living Family/patient expects to be discharged to:: Private residence Living Arrangements: Spouse/significant other Available Help at Discharge: Family Type of Home: House Home Access: Stairs to enter Secretary/administrator of Steps: 2 Entrance Stairs-Rails: None Home Layout: Able to live on main level with bedroom/bathroom     Bathroom Shower/Tub:  Walk-in shower   Bathroom Toilet: Standard     Home Equipment: None          Prior Functioning/Environment Level of Independence: Independent                 OT Problem List: Decreased strength;Decreased activity tolerance;Pain;Decreased knowledge of use of DME or AE      OT Treatment/Interventions: Self-care/ADL training;Patient/family education;DME and/or AE instruction    OT Goals(Current goals can be found in the care plan section) Acute Rehab OT Goals Patient Stated Goal: Regain IND OT Goal Formulation: With patient Time For Goal Achievement:  03/27/17 Potential to Achieve Goals: Good ADL Goals Pt Will Perform Grooming: with supervision;standing Pt Will Transfer to Toilet: with supervision;ambulating;bedside commode Pt Will Perform Toileting - Clothing Manipulation and hygiene: with supervision;sit to/from stand Pt Will Perform Tub/Shower Transfer: Shower transfer;with min guard assist;ambulating;3 in 1  OT Frequency: Min 2X/week   Barriers to D/C:            Co-evaluation              End of Session    Activity Tolerance: Patient tolerated treatment well Patient left: in bed;with call bell/phone within reach  OT Visit Diagnosis: Pain Pain - Right/Left: Right Pain - part of body: Hip                Time: 1128-1205 OT Time Calculation (min): 37 min Charges:  OT General Charges $OT Visit: 1 Procedure OT Evaluation $OT Eval Low Complexity: 1 Procedure OT Treatments $Self Care/Home Management : 8-22 mins G-Codes:     Marica Otter, OTR/L [phone] 03/20/2017  Ciarah Peace 03/20/2017, 12:46 PM

## 2017-03-20 NOTE — Progress Notes (Signed)
Physical Therapy Treatment Patient Details Name: Michele Thornton MRN: [id number] DOB: [birth year] Today's Date: 03/20/2017    History of Present Illness Pt s/p R THR    PT Comments    Pt requiring increased time for all tasks but progressing steadily with mobility.   Follow Up Recommendations  Home health PT     Equipment Recommendations  Rolling walker with 5" wheels    Recommendations for Other Services OT consult     Precautions / Restrictions Precautions Precautions: Fall Restrictions Weight Bearing Restrictions: No Other Position/Activity Restrictions: WBAT    Mobility  Bed Mobility Overal bed mobility: Needs Assistance Bed Mobility: Supine to Sit     Supine to sit: Min guard     General bed mobility comments: min cues for sequence  Transfers Overall transfer level: Needs assistance Equipment used: Rolling walker (2 wheeled) Transfers: Sit to/from Stand Sit to Stand: Min guard         General transfer comment: cues for UE/LE placement  Ambulation/Gait Ambulation/Gait assistance: Min assist;Min guard Ambulation Distance (Feet): 100 Feet (and 15' into bathroom) Assistive device: Rolling walker (2 wheeled) Gait Pattern/deviations: Step-to pattern;Decreased step length - right;Decreased step length - left;Shuffle;Trunk flexed Gait velocity: decr Gait velocity interpretation: Below normal speed for age/gender General Gait Details: cues for sequence, posture and position from [employer]            Wheelchair Mobility    Modified Rankin (Stroke Patients Only)       Balance                                            Cognition Arousal/Alertness: Awake/alert Behavior During Therapy: WFL for tasks assessed/performed Overall Cognitive Status: Within Functional Limits for tasks assessed                                        Exercises      General Comments        Pertinent Vitals/Pain Pain Assessment:  0-10 Pain Score: 4  Pain Location: R hip Pain Descriptors / Indicators: Aching;Sore Pain Intervention(s): Limited activity within patient's tolerance;Monitored during session;Premedicated before session;Ice applied    Home Living                      Prior Function            PT Goals (current goals can now be found in the care plan section) Acute Rehab PT Goals Patient Stated Goal: Regain IND PT Goal Formulation: With patient Time For Goal Achievement: 03/22/17 Potential to Achieve Goals: Good Progress towards PT goals: Progressing toward goals    Frequency    7X/week      PT Plan Current plan remains appropriate    Co-evaluation             End of Session Equipment Utilized During Treatment: Gait belt Activity Tolerance: Patient tolerated treatment well Patient left: in chair;with call bell/phone within reach;with family/visitor present Nurse Communication: Mobility status PT Visit Diagnosis: Unsteadiness on feet (R26.81);Difficulty in walking, not elsewhere classified (R26.2)     Time: 2130-8657 PT Time Calculation (min) (ACUTE ONLY): 35 min  Charges:  $Gait Training: 8-22 mins $Therapeutic Activity: 8-22 mins  G Codes:       Pg [phone]    Michele Thornton 03/20/2017, 4:34 PM

## 2017-03-20 NOTE — Progress Notes (Addendum)
   Subjective: 1 Day Post-Op Procedure(s) (LRB): RIGHT TOTAL HIP ARTHROPLASTY ANTERIOR APPROACH (Right) Patient reports pain as mild and moderate.   Patient seen in rounds with Dr. Lequita Halt. Patient is well, but has had some minor complaints of pain in the hip, requiring pain medications We will start therapy today.  If they do well with therapy and meets all goals, then will allow home later this afternoon following therapy. Plan is to go Home after hospital stay.  Objective: Vital signs in last 24 hours: Temp:  [97.5 F (36.4 C)-98.4 F (36.9 C)] 98.4 F (36.9 C) (04/12 0523) Pulse Rate:  [71-90] 81 (04/12 0523) Resp:  [11-21] 15 (04/12 0523) BP: (74-145)/(40-98) 94/62 (04/12 0523) SpO2:  [97 %-100 %] 98 % (04/12 0523) Weight:  [80.3 kg (177 lb)] 80.3 kg (177 lb) (04/11 1153)  Intake/Output from previous day:  Intake/Output Summary (Last 24 hours) at 03/20/17 0850 Last data filed at 03/20/17 0700  Gross per 24 hour  Intake             4105 ml  Output             3000 ml  Net             1105 ml    Intake/Output this shift: No intake/output data recorded.  Labs:  Recent Labs  03/20/17 0448  HGB 9.4*    Recent Labs  03/20/17 0448  WBC 10.8*  RBC 3.04*  HCT 28.4*  PLT 191    Recent Labs  03/20/17 0448  NA 140  K 4.1  CL 106  CO2 28  BUN 12  CREATININE 0.82  GLUCOSE 213*  CALCIUM 8.6*   No results for input(s): LABPT, INR in the last 72 hours.  EXAM General - Patient is Alert, Appropriate and Oriented Extremity - Neurovascular intact Sensation intact distally Intact pulses distally Dorsiflexion/Plantar flexion intact Dressing - dressing C/D/I Motor Function - intact, moving foot and toes well on exam.  Hemovac pulled without difficulty.  Past Medical History:  Diagnosis Date  . Allergy   . Asthma   . Incisional hernia   . Pneumonia   . Umbilical hernia    childhood  . Varicose veins     Assessment/Plan: 1 Day Post-Op Procedure(s)  (LRB): RIGHT TOTAL HIP ARTHROPLASTY ANTERIOR APPROACH (Right) Principal Problem:   OA (osteoarthritis) of hip  Estimated body mass index is 32.9 kg/m as calculated from the following:   Height as of this encounter: 5' 1.5" (1.562 m).   Weight as of this encounter: 80.3 kg (177 lb). Advance diet Up with therapy Discharge home - outpatient Possible home this afternoon after two sessions  DVT Prophylaxis - Xarelto Weight Bearing As Tolerated right Leg Hemovac Pulled Begin Therapy  If meets goals and able to go home: Discharge home if meets all goals Diet - Cardiac diet Follow up - in 2 weeks Activity - WBAT Disposition - Home Condition Upon Discharge - pending D/C Meds - See DC Summary DVT Prophylaxis - Xarelto  Avel Peace, PA-C Orthopaedic Surgery 03/20/2017, 8:50 AM

## 2017-03-20 NOTE — Progress Notes (Signed)
Per Ortho PA, NO HHPT - outpatient therapy. [phone]

## 2017-03-20 NOTE — Evaluation (Signed)
Physical Therapy Evaluation Patient Details Name: Michele Thornton MRN: [id number] DOB: 12-12-1950 Today's Date: 03/20/2017   History of Present Illness  Pt s/p R THR  Clinical Impression  Pt s/p R THR and presents with decreased R LE strength/ROM and post op pain limiting functional mobility.  Pt should progress to dc home with family assist and HHPT follow up.    Follow Up Recommendations Home health PT    Equipment Recommendations  Rolling walker with 5" wheels    Recommendations for Other Services OT consult     Precautions / Restrictions Precautions Precautions: Fall Restrictions Weight Bearing Restrictions: No Other Position/Activity Restrictions: WBAT      Mobility  Bed Mobility Overal bed mobility: Needs Assistance Bed Mobility: Supine to Sit     Supine to sit: Min assist;Mod assist     General bed mobility comments: increased time with cues for sequence and use of L LE to self assist.  Physical assist to manage R LE and to bring trunk to upright  Transfers Overall transfer level: Needs assistance Equipment used: Rolling walker (2 wheeled) Transfers: Sit to/from Stand Sit to Stand: Min assist;Mod assist         General transfer comment: cues for LE managment and use of UEs to self assist  Ambulation/Gait Ambulation/Gait assistance: Min assist Ambulation Distance (Feet): 95 Feet Assistive device: Rolling walker (2 wheeled) Gait Pattern/deviations: Step-to pattern;Decreased step length - right;Decreased step length - left;Shuffle;Trunk flexed Gait velocity: decr Gait velocity interpretation: Below normal speed for age/gender General Gait Details: cues for sequence, posture and position from [employer]            Wheelchair Mobility    Modified Rankin (Stroke Patients Only)       Balance                                             Pertinent Vitals/Pain Pain Assessment: 0-10 Pain Score: 4  Pain Location: R hip Pain  Descriptors / Indicators: Aching;Sore Pain Intervention(s): Limited activity within patient's tolerance;Monitored during session;Premedicated before session;Ice applied    Home Living Family/patient expects to be discharged to:: Private residence Living Arrangements: Spouse/significant other Available Help at Discharge: Family Type of Home: House Home Access: Stairs to enter Entrance Stairs-Rails: None Entrance Stairs-Number of Steps: 2 Home Layout: Able to live on main level with bedroom/bathroom Home Equipment: None      Prior Function Level of Independence: Independent               Hand Dominance        Extremity/Trunk Assessment   Upper Extremity Assessment Upper Extremity Assessment: Overall WFL for tasks assessed    Lower Extremity Assessment Lower Extremity Assessment: RLE deficits/detail RLE Deficits / Details: 2/5 strength with AAROM at hip to 75 flex and 15 abd    Cervical / Trunk Assessment Cervical / Trunk Assessment: Normal  Communication   Communication: No difficulties;HOH  Cognition Arousal/Alertness: Awake/alert Behavior During Therapy: WFL for tasks assessed/performed Overall Cognitive Status: Within Functional Limits for tasks assessed                                        General Comments      Exercises Total Joint Exercises Ankle Circles/Pumps: AROM;Both;20 reps;Supine Quad Sets: AROM;Both;10  reps;Supine Heel Slides: AAROM;Right;20 reps;Supine Hip ABduction/ADduction: AAROM;Right;15 reps;Supine   Assessment/Plan    PT Assessment Patient needs continued PT services  PT Problem List Decreased strength;Decreased range of motion;Decreased activity tolerance;Decreased mobility;Decreased knowledge of use of DME;Pain       PT Treatment Interventions DME instruction;Gait training;Stair training;Functional mobility training;Therapeutic activities;Therapeutic exercise;Patient/family education    PT Goals (Current goals  can be found in the Care Plan section)  Acute Rehab PT Goals Patient Stated Goal: Regain IND PT Goal Formulation: With patient Time For Goal Achievement: 03/22/17 Potential to Achieve Goals: Good    Frequency 7X/week   Barriers to discharge        Co-evaluation               End of Session Equipment Utilized During Treatment: Gait belt Activity Tolerance: Patient tolerated treatment well Patient left: in chair;with call bell/phone within reach;with family/visitor present Nurse Communication: Mobility status PT Visit Diagnosis: Unsteadiness on feet (R26.81);Difficulty in walking, not elsewhere classified (R26.2)    Time: 0820-0902 PT Time Calculation (min) (ACUTE ONLY): 42 min   Charges:   PT Evaluation $PT Eval Low Complexity: 1 Procedure PT Treatments $Gait Training: 8-22 mins $Therapeutic Exercise: 8-22 mins   PT G Codes:        Pg [phone]   Kalvin Buss 03/20/2017, 9:08 AM

## 2017-03-21 LAB — CBC
HCT: 26.5 % — ABNORMAL LOW (ref 36.0–46.0)
HEMOGLOBIN: 8.5 g/dL — AB (ref 12.0–15.0)
MCH: 29.4 pg (ref 26.0–34.0)
MCHC: 32.1 g/dL (ref 30.0–36.0)
MCV: 91.7 fL (ref 78.0–100.0)
Platelets: 203 10*3/uL (ref 150–400)
RBC: 2.89 MIL/uL — ABNORMAL LOW (ref 3.87–5.11)
RDW: 13.5 % (ref 11.5–15.5)
WBC: 12.9 10*3/uL — AB (ref 4.0–10.5)

## 2017-03-21 LAB — BASIC METABOLIC PANEL
ANION GAP: 4 — AB (ref 5–15)
BUN: 16 mg/dL (ref 6–20)
CO2: 29 mmol/L (ref 22–32)
Calcium: 8.5 mg/dL — ABNORMAL LOW (ref 8.9–10.3)
Chloride: 107 mmol/L (ref 101–111)
Creatinine, Ser: 0.83 mg/dL (ref 0.44–1.00)
GFR calc Af Amer: >60 mL/min (ref >60)
GLUCOSE: 168 mg/dL — AB (ref 65–99)
POTASSIUM: 4.4 mmol/L (ref 3.5–5.1)
Sodium: 140 mmol/L (ref 135–145)

## 2017-03-21 MED ORDER — POLYSACCHARIDE IRON COMPLEX 150 MG PO CAPS
150.0000 mg | ORAL_CAPSULE | Freq: Two times a day (BID) | ORAL | Status: DC
  Administered 2017-03-21: 11:00:00 150 mg via ORAL
  Filled 2017-03-21: qty 1

## 2017-03-21 NOTE — Progress Notes (Signed)
   03/21/17 1100  OT Visit Information  Last OT Received On 03/21/17  Assistance Needed +1  History of Present Illness Pt s/p R THR  Precautions  Precautions Fall  Pain Assessment  Pain Score 6  Pain Location R hip  Pain Descriptors / Indicators Aching;Sore  Pain Intervention(s) Limited activity within patient's tolerance;Monitored during session;Premedicated before session;Ice applied  Cognition  Arousal/Alertness Awake/alert  Behavior During Therapy WFL for tasks assessed/performed  Overall Cognitive Status Within Functional Limits for tasks assessed  ADL  Grooming Standing (makeup)  Armed forces technical officer Supervision/safety;Ambulation;Grab bars;RW  Tub/ IT consultant shower;Min guard;Ambulation  General ADL Comments educated on placement options for 3:1 commode. Pt had difficulty stepping over ledge with operative leg, but able to do with additional time.    Bed Mobility  Supine to sit Min guard  Sit to supine Min guard  General bed mobility comments extra time.  Pt has adjustable bed at home  Restrictions  Other Position/Activity Restrictions WBAT  Transfers  Equipment used Rolling walker (2 wheeled)  Sit to Stand Supervision  General transfer comment cues for UE placement  OT - End of Session  Activity Tolerance Patient tolerated treatment well  Patient left in bed;with call bell/phone within reach;with family/visitor present  OT Assessment/Plan  OT Visit Diagnosis Pain  Pain - Right/Left Right  Pain - part of body Hip  Follow Up Recommendations Supervision/Assistance - 24 hour  OT Equipment 3 in 1 bedside commode (delivered)  AM-PAC OT "6 Clicks" Daily Activity Outcome Measure  Help from another person eating meals? 4  Help from another person taking care of personal grooming? 3  Help from another person toileting, which includes using toliet, bedpan, or urinal? 3  Help from another person bathing (including washing, rinsing, drying)? 3  Help from another  person to put on and taking off regular upper body clothing? 3  Help from another person to put on and taking off regular lower body clothing? 2  6 Click Score 18  ADL G Code Conversion CK  OT Goal Progression  Progress towards OT goals Goals met/education completed, patient discharged from OT  OT Time Calculation  OT Start Time (ACUTE ONLY) 1022  OT Stop Time (ACUTE ONLY) 1043  OT Time Calculation (min) 21 min  OT General Charges  $OT Visit 1 Procedure  OT Treatments  $Self Care/Home Management  8-22 mins  Lesle Chris, OTR/L [phone] 03/21/2017

## 2017-03-21 NOTE — Progress Notes (Signed)
Physical Therapy Treatment Patient Details Name: Michele Thornton MRN: [id number] DOB: [birth year] Today's Date: 03/21/2017    History of Present Illness Pt s/p R THR    PT Comments    Pt progressing well with mobility and eager for dc home.  Reviewed stairs, therex and car transfers with pt and spouse.   Follow Up Recommendations  Home health PT     Equipment Recommendations  Rolling walker with 5" wheels    Recommendations for Other Services OT consult     Precautions / Restrictions Precautions Precautions: Fall Restrictions Weight Bearing Restrictions: No Other Position/Activity Restrictions: WBAT    Mobility  Bed Mobility Overal bed mobility: Needs Assistance Bed Mobility: Sit to Supine       Sit to supine: Min assist   General bed mobility comments: min cues for sequence and min assist for R LE onto bed  Transfers Overall transfer level: Needs assistance Equipment used: Rolling walker (2 wheeled) Transfers: Sit to/from Stand Sit to Stand: Supervision         General transfer comment: cues for use of UEs  Ambulation/Gait Ambulation/Gait assistance: Min guard;Supervision Ambulation Distance (Feet): 140 Feet Assistive device: Rolling walker (2 wheeled) Gait Pattern/deviations: Step-to pattern;Decreased step length - right;Decreased step length - left;Shuffle;Trunk flexed Gait velocity: decr Gait velocity interpretation: Below normal speed for age/gender General Gait Details: cues for posture and position from RW   Stairs Stairs: Yes   Stair Management: No rails;Step to pattern;Backwards;With walker Number of Stairs: 4 General stair comments: 2 steps twice with RW bkwd, cues for sequence and foot/RW placement.  Spouse assisting on second attempt and written instructions provided  Wheelchair Mobility    Modified Rankin (Stroke Patients Only)       Balance                                            Cognition  Arousal/Alertness: Awake/alert Behavior During Therapy: WFL for tasks assessed/performed Overall Cognitive Status: Within Functional Limits for tasks assessed                                        Exercises Total Joint Exercises Ankle Circles/Pumps: AROM;Both;20 reps;Supine Quad Sets: AROM;Both;10 reps;Supine Heel Slides: AAROM;Right;20 reps;Supine Hip ABduction/ADduction: AAROM;Right;15 reps;Supine    General Comments        Pertinent Vitals/Pain Pain Assessment: 0-10 Pain Score: 4  Pain Location: R hip Pain Descriptors / Indicators: Aching;Sore Pain Intervention(s): Limited activity within patient's tolerance;Monitored during session;Premedicated before session;Ice applied    Home Living                      Prior Function            PT Goals (current goals can now be found in the care plan section) Acute Rehab PT Goals Patient Stated Goal: Regain IND PT Goal Formulation: With patient Time For Goal Achievement: 03/22/17 Potential to Achieve Goals: Good Progress towards PT goals: Progressing toward goals    Frequency    7X/week      PT Plan Current plan remains appropriate    Co-evaluation             End of Session Equipment Utilized During Treatment: Gait belt Activity Tolerance: Patient tolerated treatment well Patient left: with call  bell/phone within reach;with family/visitor present;in bed Nurse Communication: Mobility status PT Visit Diagnosis: Unsteadiness on feet (R26.81);Difficulty in walking, not elsewhere classified (R26.2)     Time: 1610-9604 PT Time Calculation (min) (ACUTE ONLY): 42 min  Charges:  $Gait Training: 8-22 mins $Therapeutic Exercise: 8-22 mins $Therapeutic Activity: 8-22 mins                    G Codes:       Pg [phone]    [name] 03/21/2017, 9:49 AM

## 2017-03-21 NOTE — Progress Notes (Signed)
   Subjective: 2 Days Post-Op Procedure(s) (LRB): RIGHT TOTAL HIP ARTHROPLASTY ANTERIOR APPROACH (Right) Patient reports pain as mild and moderate.   Patient seen in rounds with Dr. Lequita Halt.   Patient is well, but has had some minor complaints of pain in the hip and thigh, requiring pain medications Patient is ready to go home today.  Objective: Vital signs in last 24 hours: Temp:  [98 F (36.7 C)-98.6 F (37 C)] 98.3 F (36.8 C) (04/13 0520) Pulse Rate:  [83-92] 88 (04/13 0520) Resp:  [15-16] 16 (04/13 0520) BP: (94-113)/(58-70) 113/61 (04/13 0520) SpO2:  [92 %-95 %] 93 % (04/13 0520)  Intake/Output from previous day:  Intake/Output Summary (Last 24 hours) at 03/21/17 0735 Last data filed at 03/21/17 0520  Gross per 24 hour  Intake              960 ml  Output              600 ml  Net              360 ml    Intake/Output this shift: No intake/output data recorded.  Labs:  Recent Labs  03/20/17 0448 03/21/17 0415  HGB 9.4* 8.5*    Recent Labs  03/20/17 0448 03/21/17 0415  WBC 10.8* 12.9*  RBC 3.04* 2.89*  HCT 28.4* 26.5*  PLT 191 203    Recent Labs  03/20/17 0448 03/21/17 0415  NA 140 140  K 4.1 4.4  CL 106 107  CO2 28 29  BUN 12 16  CREATININE 0.82 0.83  GLUCOSE 213* 168*  CALCIUM 8.6* 8.5*   No results for input(s): LABPT, INR in the last 72 hours.  EXAM: General - Patient is Alert, Appropriate and Oriented Extremity - Neurovascular intact Sensation intact distally Intact pulses distally Dorsiflexion/Plantar flexion intact Incision - clean, dry, no drainage Motor Function - intact, moving foot and toes well on exam.   Assessment/Plan: 2 Days Post-Op Procedure(s) (LRB): RIGHT TOTAL HIP ARTHROPLASTY ANTERIOR APPROACH (Right) Procedure(s) (LRB): RIGHT TOTAL HIP ARTHROPLASTY ANTERIOR APPROACH (Right) Past Medical History:  Diagnosis Date  . Allergy   . Asthma   . Incisional hernia   . Pneumonia   . Umbilical hernia    childhood  .  Varicose veins    Principal Problem:   OA (osteoarthritis) of hip  Estimated body mass index is 32.9 kg/m as calculated from the following:   Height as of this encounter: 5' 1.5" (1.562 m).   Weight as of this encounter: 80.3 kg (177 lb). Advance diet Up with therapy Discharge home - straight to outpatient Diet - Regular diet Follow up - in 2 weeks Activity - WBAT Disposition - Home Condition Upon Discharge - Good D/C Meds - See DC Summary DVT Prophylaxis - Xarelto  Avel Peace, PA-C Orthopaedic Surgery 03/21/2017, 7:35 AM

## 2017-03-22 NOTE — Care Management Note (Signed)
Case Management Note  Patient Details  Name: Ortha Metts MRN: [id number] Date of Birth: Nov 25, 1951  Subjective/Objective:      s/p R THR              Action/Plan: Discharge Planning: Received call from Ortho PA and pt will need HH PT. NCM spoke to pt and husband at home to assist with care. She has RW and 3n1 bedside commode. Offered choice for [facility]. Pt agreeable to [facility]. [facility]ed [facility] for [facility].   PCP Robb Matar B  Expected Discharge Date:  03/21/17               Expected Discharge Plan:  Home/Self Care  In-House Referral:  NA  Discharge planning Services  CM Consult  Post Acute Care Choice:  NA Choice offered to:  Patient  DME Arranged:  N/A DME Agency:  NA  HH Arranged:  PT HH Agency:  [facility] ([facility]  Status of Service:  Completed, signed off  If discussed at [employer], dates discussed:    Additional Comments:  Elliot Cousin, RN 03/22/2017, 2:28 PM

## 2017-05-09 NOTE — Addendum Note (Signed)
Addendum  created 05/09/17 1314 by Gerold Sar D, MD   Sign clinical note    

## 2017-10-10 DIAGNOSIS — Z86010 Personal history of colonic polyps: Secondary | ICD-10-CM | POA: Diagnosis not present

## 2017-10-10 DIAGNOSIS — Z1211 Encounter for screening for malignant neoplasm of colon: Secondary | ICD-10-CM | POA: Diagnosis not present

## 2017-12-18 DIAGNOSIS — M1612 Unilateral primary osteoarthritis, left hip: Secondary | ICD-10-CM | POA: Diagnosis not present

## 2017-12-18 DIAGNOSIS — Z96641 Presence of right artificial hip joint: Secondary | ICD-10-CM | POA: Diagnosis not present

## 2018-01-06 DIAGNOSIS — M1612 Unilateral primary osteoarthritis, left hip: Secondary | ICD-10-CM | POA: Diagnosis not present

## 2018-01-30 DIAGNOSIS — M1612 Unilateral primary osteoarthritis, left hip: Secondary | ICD-10-CM | POA: Diagnosis not present

## 2018-02-19 DIAGNOSIS — Z79899 Other long term (current) drug therapy: Secondary | ICD-10-CM | POA: Diagnosis not present

## 2018-02-19 DIAGNOSIS — L853 Xerosis cutis: Secondary | ICD-10-CM | POA: Diagnosis not present

## 2018-02-19 DIAGNOSIS — L309 Dermatitis, unspecified: Secondary | ICD-10-CM | POA: Diagnosis not present

## 2018-03-07 IMAGING — DX DG PORTABLE PELVIS
1 series · 1 of 1 positions shown · non-contrast
Comparison: None in PACs

CLINICAL DATA: Status post right hip arthroplasty

EXAM:
PORTABLE PELVIS 1-2 VIEWS

[pelvis ap]
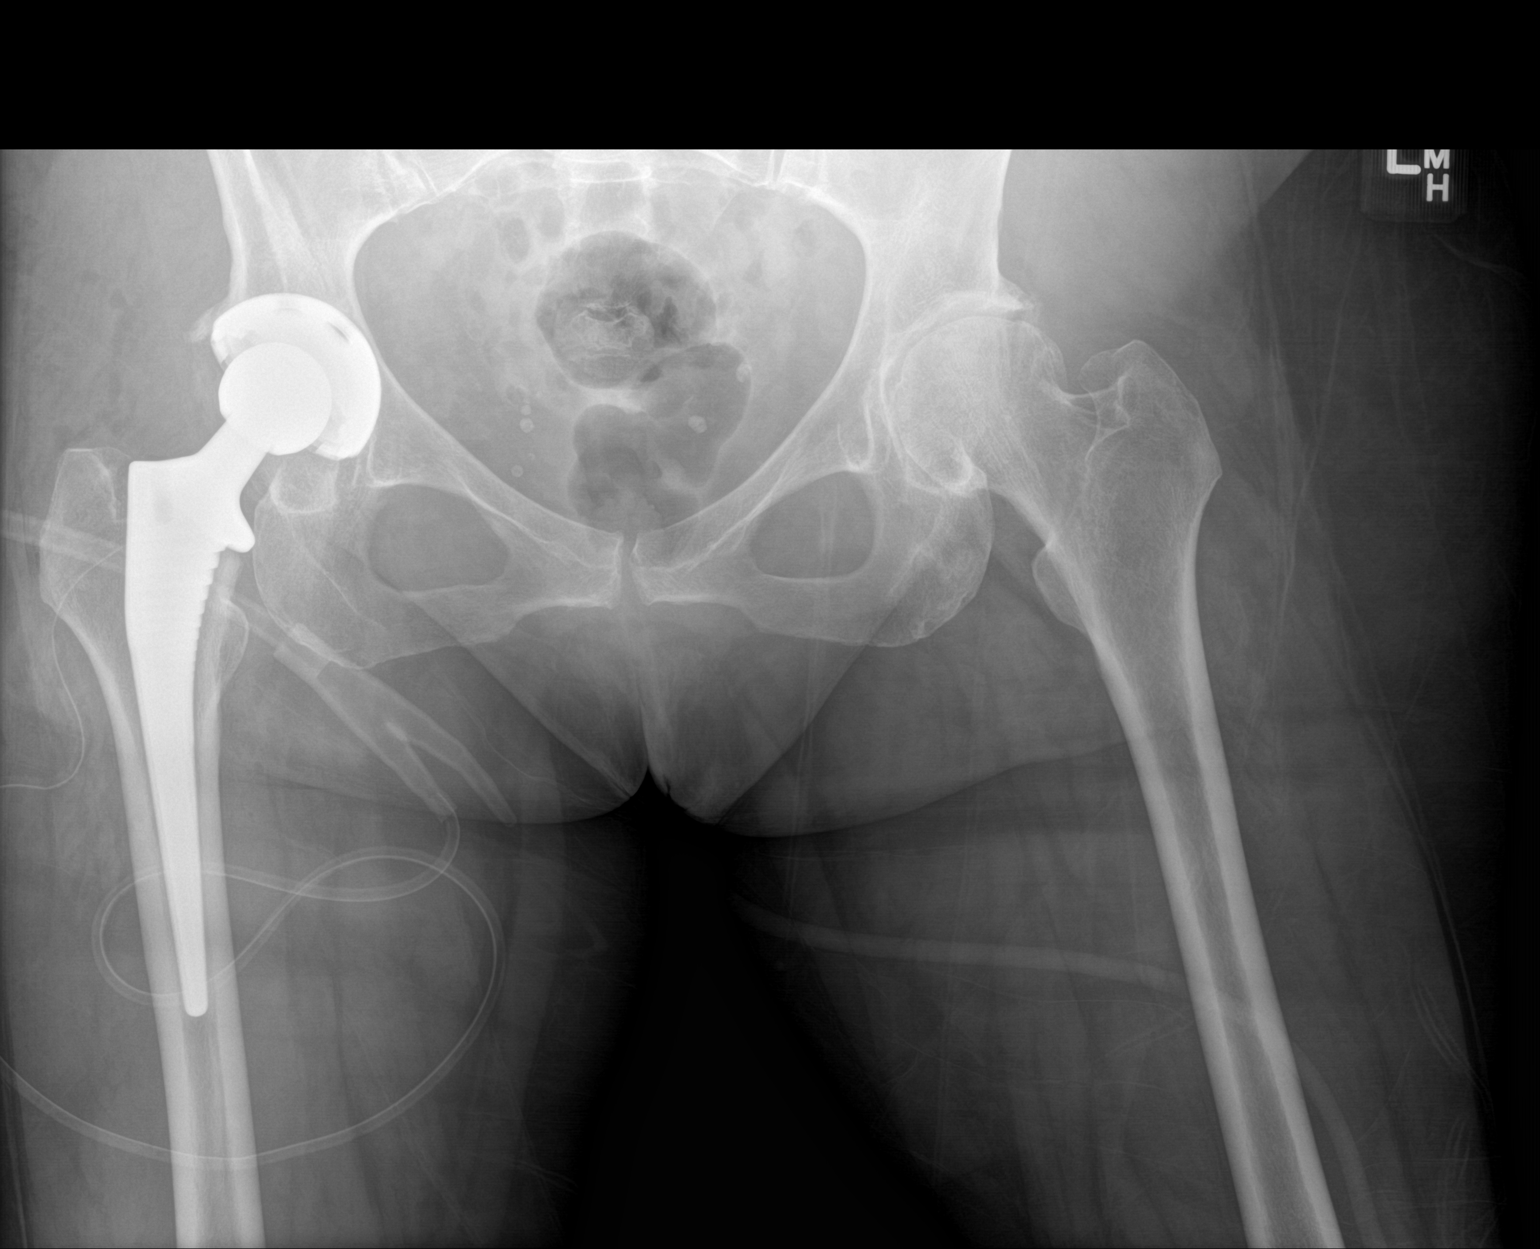

[1 of 1 positions shown; findings below may reference images not displayed]

FINDINGS: The patient has undergone placement of a right hip joint prosthesis.
Radiographic positioning of the prosthetic components is good. A
surgical drain line is present.
IMPRESSION: There is no immediate postprocedure complication following right hip
joint prosthesis placement.

## 2018-03-26 ENCOUNTER — Ambulatory Visit: Payer: Self-pay | Admitting: Orthopedic Surgery

## 2018-04-14 DIAGNOSIS — Z9181 History of falling: Secondary | ICD-10-CM | POA: Diagnosis not present

## 2018-04-14 DIAGNOSIS — Z6833 Body mass index (BMI) 33.0-33.9, adult: Secondary | ICD-10-CM | POA: Diagnosis not present

## 2018-04-14 DIAGNOSIS — M1990 Unspecified osteoarthritis, unspecified site: Secondary | ICD-10-CM | POA: Diagnosis not present

## 2018-04-14 DIAGNOSIS — Z1331 Encounter for screening for depression: Secondary | ICD-10-CM | POA: Diagnosis not present

## 2018-04-14 DIAGNOSIS — Z01818 Encounter for other preprocedural examination: Secondary | ICD-10-CM | POA: Diagnosis not present

## 2018-04-21 NOTE — Patient Instructions (Addendum)
Michele Thornton  04/21/2018   Your procedure is scheduled on: Wednesday 04/29/2018  Report to [facility] Main  Entrance              Report to admitting at  1215 AM    Call this number if you have problems the morning of surgery [phone]    Remember: Do not eat food After Midnight. YOU MAY HAVE CLEAR LIQUIDS FROM MIDNIGHT UNTIL 8:45AM. NOTHING BY MOUTH AFTER 8:45AM!       Take these medicines the morning of surgery with A SIP OF WATER: use Symbicort inhaler and Albuterol inhaler if needed and bring inhalers with you to the hospital the morning of surgery. Tylenol if needed, loratadine if needed                                You may not have any metal on your body including hair pins and              piercings  Do not wear jewelry, make-up, lotions, powders or perfumes, deodorant             Do not wear nail polish.  Do not shave  48 hours prior to surgery.     Do not bring valuables to the hospital. [facility] IS NOT             RESPONSIBLE   FOR VALUABLES.  Contacts, dentures or bridgework may not be worn into surgery.  Leave suitcase in the car. After surgery it may be brought to your room.                Please read over the following fact sheets you were given: _____________________________________________________________________             [facility] - Preparing for Surgery Before surgery, you can play an important role.  Because skin is not sterile, your skin needs to be as free of germs as possible.  You can reduce the number of germs on your skin by washing with CHG (chlorahexidine gluconate) soap before surgery.  CHG is an antiseptic cleaner which kills germs and bonds with the skin to continue killing germs even after washing. Please DO NOT use if you have an allergy to CHG or antibacterial soaps.  If your skin becomes reddened/irritated stop using the CHG and inform your nurse when you arrive at Short Stay. Do not shave (including legs and  underarms) for at least 48 hours prior to the first CHG shower.  You may shave your face/neck. Please follow these instructions carefully:  1.  Shower with CHG Soap the night before surgery and the  morning of Surgery.  2.  If you choose to wash your hair, wash your hair first as usual with your  normal  shampoo.  3.  After you shampoo, rinse your hair and body thoroughly to remove the  shampoo.                           4.  Use CHG as you would any other liquid soap.  You can apply chg directly  to the skin and wash                       Gently with a scrungie or  clean washcloth.  5.  Apply the CHG Soap to your body ONLY FROM THE NECK DOWN.   Do not use on face/ open                           Wound or open sores. Avoid contact with eyes, ears mouth and genitals (private parts).                       Wash face,  Genitals (private parts) with your normal soap.             6.  Wash thoroughly, paying special attention to the area where your surgery  will be performed.  7.  Thoroughly rinse your body with warm water from the neck down.  8.  DO NOT shower/wash with your normal soap after using and rinsing off  the CHG Soap.                9.  Pat yourself dry with a clean towel.            10.  Wear clean pajamas.            11.  Place clean sheets on your bed the night of your first shower and do not  sleep with pets. Day of Surgery : Do not apply any lotions/deodorants the morning of surgery.  Please wear clean clothes to the hospital/surgery center.  FAILURE TO FOLLOW THESE INSTRUCTIONS MAY RESULT IN THE CANCELLATION OF YOUR SURGERY PATIENT SIGNATURE_________________________________  NURSE SIGNATURE__________________________________     Incentive Spirometer  An incentive spirometer is a tool that can help keep your lungs clear and active. This tool measures how well you are filling your lungs with each breath. Taking long deep breaths may help reverse or decrease the chance of developing  breathing (pulmonary) problems (especially infection) following:  A long period of time when you are unable to move or be active. BEFORE THE PROCEDURE   If the spirometer includes an indicator to show your best effort, your nurse or respiratory therapist will set it to a desired goal.  If possible, sit up straight or lean slightly forward. Try not to slouch.  Hold the incentive spirometer in an upright position. INSTRUCTIONS FOR USE  1. Sit on the edge of your bed if possible, or sit up as far as you can in bed or on a chair. 2. Hold the incentive spirometer in an upright position. 3. Breathe out normally. 4. Place the mouthpiece in your mouth and seal your lips tightly around it. 5. Breathe in slowly and as deeply as possible, raising the piston or the ball toward the top of the column. 6. Hold your breath for 3-5 seconds or for as long as possible. Allow the piston or ball to fall to the bottom of the column. 7. Remove the mouthpiece from your mouth and breathe out normally. 8. Rest for a few seconds and repeat Steps 1 through 7 at least 10 times every 1-2 hours when you are awake. Take your time and take a few normal breaths between deep breaths. 9. The spirometer may include an indicator to show your best effort. Use the indicator as a goal to work toward during each repetition. 10. After each set of 10 deep breaths, practice coughing to be sure your lungs are clear. If you have an incision (the cut made at the time of surgery), support your incision when coughing  by placing a pillow or rolled up towels firmly against it. Once you are able to get out of bed, walk around indoors and cough well. You may stop using the incentive spirometer when instructed by your caregiver.  RISKS AND COMPLICATIONS  Take your time so you do not get dizzy or light-headed.  If you are in pain, you may need to take or ask for pain medication before doing incentive spirometry. It is harder to take a deep  breath if you are having pain. AFTER USE  Rest and breathe slowly and easily.  It can be helpful to keep track of a log of your progress. Your caregiver can provide you with a simple table to help with this. If you are using the spirometer at home, follow these instructions: SEEK MEDICAL CARE IF:   You are having difficultly using the spirometer.  You have trouble using the spirometer as often as instructed.  Your pain medication is not giving enough relief while using the spirometer.  You develop fever of 100.5 F (38.1 C) or higher. SEEK IMMEDIATE MEDICAL CARE IF:   You cough up bloody sputum that had not been present before.  You develop fever of 102 F (38.9 C) or greater.  You develop worsening pain at or near the incision site. MAKE SURE YOU:   Understand these instructions.  Will watch your condition.  Will get help right away if you are not doing well or get worse. Document Released: 04/07/2007 Document Revised: 02/17/2012 Document Reviewed: 06/08/2007 [employer] Patient Information 2014 [employer], [place].   ________________________________________________________________________  WHAT IS A BLOOD TRANSFUSION? Blood Transfusion Information  A transfusion is the replacement of blood or some of its parts. Blood is made up of multiple cells which provide different functions.  Red blood cells carry oxygen and are used for blood loss replacement.  White blood cells fight against infection.  Platelets control bleeding.  Plasma helps clot blood.  Other blood products are available for specialized needs, such as hemophilia or other clotting disorders. BEFORE THE TRANSFUSION  Who gives blood for transfusions?   Healthy volunteers who are fully evaluated to make sure their blood is safe. This is blood bank blood. Transfusion therapy is the safest it has ever been in the practice of medicine. Before blood is taken from a donor, a complete history is taken to make sure  that person has no history of diseases nor engages in risky social behavior (examples are intravenous drug use or sexual activity with multiple partners). The donor's travel history is screened to minimize risk of transmitting infections, such as malaria. The donated blood is tested for signs of infectious diseases, such as HIV and hepatitis. The blood is then tested to be sure it is compatible with you in order to minimize the chance of a transfusion reaction. If you or a relative donates blood, this is often done in anticipation of surgery and is not appropriate for emergency situations. It takes many days to process the donated blood. RISKS AND COMPLICATIONS Although transfusion therapy is very safe and saves many lives, the main dangers of transfusion include:   Getting an infectious disease.  Developing a transfusion reaction. This is an allergic reaction to something in the blood you were given. Every precaution is taken to prevent this. The decision to have a blood transfusion has been considered carefully by your caregiver before blood is given. Blood is not given unless the benefits outweigh the risks. AFTER THE TRANSFUSION  Right after receiving a  blood transfusion, you will usually feel much better and more energetic. This is especially true if your red blood cells have gotten low (anemic). The transfusion raises the level of the red blood cells which carry oxygen, and this usually causes an energy increase.  The nurse administering the transfusion will monitor you carefully for complications. HOME CARE INSTRUCTIONS  No special instructions are needed after a transfusion. You may find your energy is better. Speak with your caregiver about any limitations on activity for underlying diseases you may have. SEEK MEDICAL CARE IF:   Your condition is not improving after your transfusion.  You develop redness or irritation at the intravenous (IV) site. SEEK IMMEDIATE MEDICAL CARE IF:  Any of  the following symptoms occur over the next 12 hours:  Shaking chills.  You have a temperature by mouth above 102 F (38.9 C), not controlled by medicine.  Chest, back, or muscle pain.  People around you feel you are not acting correctly or are confused.  Shortness of breath or difficulty breathing.  Dizziness and fainting.  You get a rash or develop hives.  You have a decrease in urine output.  Your urine turns a dark color or changes to pink, red, or brown. Any of the following symptoms occur over the next 10 days:  You have a temperature by mouth above 102 F (38.9 C), not controlled by medicine.  Shortness of breath.  Weakness after normal activity.  The white part of the eye turns yellow (jaundice).  You have a decrease in the amount of urine or are urinating less often.  Your urine turns a dark color or changes to pink, red, or brown. Document Released: 11/22/2000 Document Revised: 02/17/2012 Document Reviewed: 07/11/2008 [facility] Patient Information 2014 [place], [place].  _______________________________________________________________________

## 2018-04-22 ENCOUNTER — Other Ambulatory Visit: Payer: Self-pay

## 2018-04-22 ENCOUNTER — Encounter (HOSPITAL_COMMUNITY)
Admission: RE | Admit: 2018-04-22 | Discharge: 2018-04-22 | Disposition: A | Discharge status: Home / Self Care | Payer: PPO | Source: Ambulatory Visit | Attending: Orthopedic Surgery | Admitting: Orthopedic Surgery

## 2018-04-22 ENCOUNTER — Encounter (HOSPITAL_COMMUNITY): Payer: Self-pay

## 2018-04-22 DIAGNOSIS — M1612 Unilateral primary osteoarthritis, left hip: Secondary | ICD-10-CM | POA: Insufficient documentation

## 2018-04-22 DIAGNOSIS — Z01812 Encounter for preprocedural laboratory examination: Secondary | ICD-10-CM | POA: Diagnosis not present

## 2018-04-22 LAB — CBC
HCT: 42.3 % (ref 36.0–46.0)
HEMOGLOBIN: 13.5 g/dL (ref 12.0–15.0)
MCH: 29.6 pg (ref 26.0–34.0)
MCHC: 31.9 g/dL (ref 30.0–36.0)
MCV: 92.8 fL (ref 78.0–100.0)
Platelets: 239 10*3/uL (ref 150–400)
RBC: 4.56 MIL/uL (ref 3.87–5.11)
RDW: 14.1 % (ref 11.5–15.5)
WBC: 6.1 10*3/uL (ref 4.0–10.5)

## 2018-04-22 LAB — PROTIME-INR
INR: 0.9
Prothrombin Time: 12.1 seconds (ref 11.4–15.2)

## 2018-04-22 LAB — COMPREHENSIVE METABOLIC PANEL
ALBUMIN: 4 g/dL (ref 3.5–5.0)
ALK PHOS: 112 U/L (ref 38–126)
ALT: 23 U/L (ref 14–54)
ANION GAP: 13 (ref 5–15)
AST: 24 U/L (ref 15–41)
BUN: 16 mg/dL (ref 6–20)
CALCIUM: 9 mg/dL (ref 8.9–10.3)
CO2: 25 mmol/L (ref 22–32)
CREATININE: 0.71 mg/dL (ref 0.44–1.00)
Chloride: 105 mmol/L (ref 101–111)
GFR calc Af Amer: >60 mL/min (ref >60)
GFR calc non Af Amer: >60 mL/min (ref >60)
GLUCOSE: 84 mg/dL (ref 65–99)
Potassium: 4.4 mmol/L (ref 3.5–5.1)
SODIUM: 143 mmol/L (ref 135–145)
TOTAL PROTEIN: 7.1 g/dL (ref 6.5–8.1)
Total Bilirubin: 0.6 mg/dL (ref 0.3–1.2)

## 2018-04-22 LAB — SURGICAL PCR SCREEN
MRSA, PCR: NEGATIVE
Staphylococcus aureus: POSITIVE — AB

## 2018-04-22 LAB — APTT: APTT: 25 s (ref 24–36)

## 2018-04-22 NOTE — Progress Notes (Addendum)
RN spoke with [facility] at [facility] in [place] PHONE [phone], Valinda Hoar [phone] to request recent labs

## 2018-04-22 NOTE — Progress Notes (Signed)
CHART LEFT WITH VERNA TO CHECK TYPE AND SCREEN

## 2018-04-28 MED ORDER — TRANEXAMIC ACID 1000 MG/10ML IV SOLN
1000.0000 mg | INTRAVENOUS | Status: AC
  Administered 2018-04-29: 1000 mg via INTRAVENOUS
  Filled 2018-04-28: qty 1100

## 2018-04-28 MED ORDER — TRANEXAMIC ACID 1000 MG/10ML IV SOLN
1000.0000 mg | INTRAVENOUS | Status: DC
  Filled 2018-04-28: qty 10

## 2018-04-28 NOTE — H&P (Signed)
TOTAL HIP ADMISSION H&P  Patient is admitted for left total hip arthroplasty.  Subjective:  Chief Complaint: left hip pain  HPI: Michele Thornton, 67 y.o. female, has a history of pain and functional disability in the left hip(s) due to arthritis and patient has failed non-surgical conservative treatments for greater than 12 weeks to include NSAID's and/or analgesics, corticosteriod injections, flexibility and strengthening excercises and activity modification.  Onset of symptoms was gradual starting 2 years ago with gradually worsening course since that time.The patient noted no past surgery on the left hip(s).  Patient currently rates pain in the left hip at 8 out of 10 with activity. Patient has night pain, worsening of pain with activity and weight bearing, pain that interfers with activities of daily living and pain with passive range of motion. Patient has evidence of subchondral cysts, periarticular osteophytes and joint space narrowing by imaging studies. This condition presents safety issues increasing the risk of falls.   There is no current active infection.  Patient Active Problem List   Diagnosis Date Noted  . OA (osteoarthritis) of hip 03/19/2017  . Varicose veins of bilateral lower extremities with other complications 07/01/2016   Past Medical History:  Diagnosis Date  . Allergy   . Asthma   . Incisional hernia    LEFT FROM  CHILDHOOD UMBLICAL HERNIA REPAIR   . Pneumonia    OVER 10 YEARS AGO   . Umbilical hernia    childhood  . Varicose veins     Past Surgical History:  Procedure Laterality Date  . arm surgery Right    repair fracture   . TOTAL HIP ARTHROPLASTY Right 03/19/2017   Procedure: RIGHT TOTAL HIP ARTHROPLASTY ANTERIOR APPROACH;  Surgeon: Ollen Gross, MD;  Location: [facility] ORS;  Service: Orthopedics;  Laterality: Right;  requests    Current Outpatient Medications  Medication Sig Dispense Refill Last Dose  . acetaminophen (TYLENOL) 500 MG tablet Take 1,000  mg by mouth 3 (three) times daily as needed (for pain).   Past Month at Unknown time  . albuterol (VENTOLIN HFA) 108 (90 BASE) MCG/ACT inhaler Inhale 2 puffs into the lungs every 4 (four) hours as needed for wheezing or shortness of breath.   03/18/2017 at Unknown time  . budesonide-formoterol (SYMBICORT) 160-4.5 MCG/ACT inhaler Inhale 2 puffs into the lungs 2 (two) times daily.   03/18/2017 at Unknown time  . Carboxymeth-Glycerin-Polysorb (REFRESH OPTIVE ADVANCED) 0.5-1-0.5 % SOLN Place 1 drop into both eyes at bedtime.   03/19/2017 at Unknown time  . celecoxib (CELEBREX) 200 MG capsule Take 200 mg by mouth daily.     . diphenhydrAMINE (ALLERGY RELIEF) 25 MG tablet Take 25 mg by mouth 2 (two) times daily as needed (for allergies.).    More than a month at Unknown time  . loratadine (CLARITIN) 10 MG tablet Take 10 mg by mouth daily as needed (FOR RUNNY NOSE/ALLERGIES.).     Marland Kitchen naphazoline-pheniramine (VISINE-A) 0.025-0.3 % ophthalmic solution Place 1 drop into both eyes 4 (four) times daily as needed (for irritation/allergy eyes.).   More than a month at Unknown time  . oxyCODONE (OXY IR/ROXICODONE) 5 MG immediate release tablet Take 1-2 tablets (5-10 mg total) by mouth every 4 (four) hours as needed for moderate pain or severe pain. (Patient taking differently: Take 2.5-5 mg by mouth every 4 (four) hours as needed for moderate pain or severe pain. ) 84 tablet 0   . traMADol (ULTRAM) 50 MG tablet Take 1-2 tablets (50-100 mg total)  by mouth every 6 (six) hours as needed for moderate pain. 56 tablet 0    Allergies  Allergen Reactions  . Adhesive [Tape]     CAUSES SKIN REDNESS   . Sulfa Antibiotics Other (See Comments)    Unknown--cannot recall reaction    Social History   Tobacco Use  . Smoking status: Former Smoker    Types: Cigarettes    Last attempt to quit: 07/02/2007    Years since quitting: 10.8  . Smokeless tobacco: Never Used  Substance Use Topics  . Alcohol use: Yes    Comment: monthly     Family History  Problem Relation Age of Onset  . Scoliosis Mother   . Arthritis Mother   . Stroke Mother      Review of Systems  Constitutional: Negative.   HENT: Negative.   Eyes: Positive for blurred vision. Negative for double vision, photophobia, pain, discharge and redness.  Respiratory: Positive for shortness of breath. Negative for cough, hemoptysis, sputum production and wheezing.   Cardiovascular: Negative.   Gastrointestinal: Negative.   Genitourinary: Positive for frequency. Negative for dysuria, flank pain, hematuria and urgency.  Musculoskeletal: Positive for joint pain and myalgias. Negative for back pain, falls and neck pain.  Skin: Negative.   Neurological: Negative.   Endo/Heme/Allergies: Negative for environmental allergies and polydipsia. Bruises/bleeds easily.  Psychiatric/Behavioral: Negative.     Objective:  Physical Exam  Constitutional: She is oriented to person, place, and time. She appears well-developed. No distress.  Obese  HENT:  Head: Normocephalic and atraumatic.  Right Ear: External ear normal.  Left Ear: External ear normal.  Nose: Nose normal.  Mouth/Throat: Oropharynx is clear and moist.  Eyes: Conjunctivae and EOM are normal.  Neck: Normal range of motion. Neck supple.  Cardiovascular: Normal rate, regular rhythm, normal heart sounds and intact distal pulses.  No murmur heard. Respiratory: Effort normal and breath sounds normal. No respiratory distress. She has no wheezes.  GI: Soft. She exhibits no distension. There is no tenderness.  Musculoskeletal:  Her LEFT hip can be flexed to 100 with no internal rotation by 10 of external rotation 10 of abduction. She has an antalgic gait pattern on the LEFT.  Neurological: She is alert and oriented to person, place, and time. She has normal strength. No sensory deficit.  Skin: No rash noted. She is not diaphoretic. No erythema.  Psychiatric: She has a normal mood and affect. Her behavior  is normal.    Vital Signs Ht: 5 ft 2 in  Wt: 180 lbs  BMI: 32.9  BP: 118/76 Pulse: 75 bpm  Imaging Review Plain radiographs demonstrate severe degenerative joint disease of the left hip(s). The bone quality appears to be good for age and reported activity level.    Preoperative templating of the joint replacement has been completed, documented, and submitted to the Operating Room personnel in order to optimize intra-operative equipment management.     Assessment/Plan:  End stage primary osteoarthritis, left hip(s)  The patient history, physical examination, clinical judgement of the provider and imaging studies are consistent with end stage degenerative joint disease of the left hip(s) and total hip arthroplasty is deemed medically necessary. The treatment options including medical management, injection therapy, arthroscopy and arthroplasty were discussed at length. The risks and benefits of total hip arthroplasty were presented and reviewed. The risks due to aseptic loosening, infection, stiffness, dislocation/subluxation,  thromboembolic complications and other imponderables were discussed.  The patient acknowledged the explanation, agreed to proceed with the plan and  consent was signed. Patient is being admitted for inpatient treatment for surgery, pain control, PT, OT, prophylactic antibiotics, VTE prophylaxis, progressive ambulation and ADL's and discharge planning.The patient is planning to be discharged home with HEP     Therapy Plans: HEP Disposition: Home with husband Planned DVT prophylaxis: aspirin  BID DME needed: none PCP: Dr. Tomasa Blase Other: TXA IV   Dimitri Ped, PA-C

## 2018-04-29 ENCOUNTER — Encounter (HOSPITAL_COMMUNITY): Payer: Self-pay | Admitting: *Deleted

## 2018-04-29 ENCOUNTER — Inpatient Hospital Stay (HOSPITAL_COMMUNITY): Payer: PPO | Admitting: Anesthesiology

## 2018-04-29 ENCOUNTER — Inpatient Hospital Stay (HOSPITAL_COMMUNITY): Payer: PPO

## 2018-04-29 ENCOUNTER — Other Ambulatory Visit: Payer: Self-pay

## 2018-04-29 ENCOUNTER — Encounter (HOSPITAL_COMMUNITY)
Admission: RE | Disposition: A | Discharge status: Home / Self Care | Payer: Self-pay | Source: Ambulatory Visit | Attending: Orthopedic Surgery

## 2018-04-29 ENCOUNTER — Inpatient Hospital Stay (HOSPITAL_COMMUNITY)
Admission: RE | Admit: 2018-04-29 | Discharge: 2018-05-01 | DRG: 470 | Disposition: A | Discharge status: Home / Self Care | Payer: PPO | Source: Ambulatory Visit | Attending: Orthopedic Surgery | Admitting: Orthopedic Surgery

## 2018-04-29 DIAGNOSIS — Z8261 Family history of arthritis: Secondary | ICD-10-CM | POA: Diagnosis not present

## 2018-04-29 DIAGNOSIS — Z96649 Presence of unspecified artificial hip joint: Secondary | ICD-10-CM

## 2018-04-29 DIAGNOSIS — Z6833 Body mass index (BMI) 33.0-33.9, adult: Secondary | ICD-10-CM | POA: Diagnosis not present

## 2018-04-29 DIAGNOSIS — Z87891 Personal history of nicotine dependence: Secondary | ICD-10-CM

## 2018-04-29 DIAGNOSIS — Z7951 Long term (current) use of inhaled steroids: Secondary | ICD-10-CM | POA: Diagnosis not present

## 2018-04-29 DIAGNOSIS — J45909 Unspecified asthma, uncomplicated: Secondary | ICD-10-CM | POA: Diagnosis not present

## 2018-04-29 DIAGNOSIS — Z823 Family history of stroke: Secondary | ICD-10-CM | POA: Diagnosis not present

## 2018-04-29 DIAGNOSIS — Z91048 Other nonmedicinal substance allergy status: Secondary | ICD-10-CM

## 2018-04-29 DIAGNOSIS — Z79899 Other long term (current) drug therapy: Secondary | ICD-10-CM | POA: Diagnosis not present

## 2018-04-29 DIAGNOSIS — M1612 Unilateral primary osteoarthritis, left hip: Secondary | ICD-10-CM | POA: Diagnosis not present

## 2018-04-29 DIAGNOSIS — M169 Osteoarthritis of hip, unspecified: Secondary | ICD-10-CM | POA: Diagnosis present

## 2018-04-29 DIAGNOSIS — E669 Obesity, unspecified: Secondary | ICD-10-CM | POA: Diagnosis not present

## 2018-04-29 DIAGNOSIS — H538 Other visual disturbances: Secondary | ICD-10-CM | POA: Diagnosis not present

## 2018-04-29 DIAGNOSIS — Z96641 Presence of right artificial hip joint: Secondary | ICD-10-CM | POA: Diagnosis not present

## 2018-04-29 DIAGNOSIS — Z882 Allergy status to sulfonamides status: Secondary | ICD-10-CM | POA: Diagnosis not present

## 2018-04-29 DIAGNOSIS — I83893 Varicose veins of bilateral lower extremities with other complications: Secondary | ICD-10-CM | POA: Diagnosis not present

## 2018-04-29 DIAGNOSIS — I50812 Chronic right heart failure: Secondary | ICD-10-CM | POA: Diagnosis present

## 2018-04-29 DIAGNOSIS — Z79891 Long term (current) use of opiate analgesic: Secondary | ICD-10-CM

## 2018-04-29 DIAGNOSIS — Z471 Aftercare following joint replacement surgery: Secondary | ICD-10-CM | POA: Diagnosis not present

## 2018-04-29 DIAGNOSIS — Z96642 Presence of left artificial hip joint: Secondary | ICD-10-CM | POA: Diagnosis not present

## 2018-04-29 HISTORY — PX: TOTAL HIP ARTHROPLASTY: SHX124

## 2018-04-29 LAB — TYPE AND SCREEN
ABO/RH(D): O POS
Antibody Screen: NEGATIVE

## 2018-04-29 SURGERY — ARTHROPLASTY, HIP, TOTAL, ANTERIOR APPROACH
Anesthesia: Spinal | Site: Hip | Laterality: Left

## 2018-04-29 MED ORDER — BISACODYL 10 MG RE SUPP: 10.0000 mg | Freq: Every day | RECTAL | Status: DC | PRN

## 2018-04-29 MED ORDER — LACTATED RINGERS IV SOLN
INTRAVENOUS | Status: DC
  Administered 2018-04-29 (×2): via INTRAVENOUS

## 2018-04-29 MED ORDER — METHOCARBAMOL 1000 MG/10ML IJ SOLN
500.0000 mg | Freq: Four times a day (QID) | INTRAVENOUS | Status: DC | PRN
  Administered 2018-04-29: 500 mg via INTRAVENOUS
  Filled 2018-04-29: qty 550

## 2018-04-29 MED ORDER — BUPIVACAINE-EPINEPHRINE (PF) 0.25% -1:200000 IJ SOLN
INTRAMUSCULAR | Status: DC | PRN
  Administered 2018-04-29: 30 mL

## 2018-04-29 MED ORDER — MENTHOL 3 MG MT LOZG: 1.0000 | LOZENGE | OROMUCOSAL | Status: DC | PRN

## 2018-04-29 MED ORDER — PHENYLEPHRINE HCL 10 MG/ML IJ SOLN
INTRAVENOUS | Status: DC | PRN
  Administered 2018-04-29: 50 ug/min via INTRAVENOUS

## 2018-04-29 MED ORDER — FENTANYL CITRATE (PF) 100 MCG/2ML IJ SOLN
INTRAMUSCULAR | Status: AC
  Filled 2018-04-29: qty 2

## 2018-04-29 MED ORDER — METOCLOPRAMIDE HCL 5 MG PO TABS: 5.0000 mg | ORAL_TABLET | Freq: Three times a day (TID) | ORAL | Status: DC | PRN

## 2018-04-29 MED ORDER — OXYCODONE HCL 5 MG PO TABS
5.0000 mg | ORAL_TABLET | ORAL | Status: DC | PRN
  Administered 2018-04-29: 5 mg via ORAL
  Administered 2018-04-29: 10 mg via ORAL
  Administered 2018-04-29: 5 mg via ORAL
  Administered 2018-04-30 (×2): 10 mg via ORAL
  Administered 2018-05-01: 5 mg via ORAL
  Filled 2018-04-29 (×3): qty 2

## 2018-04-29 MED ORDER — ALBUMIN HUMAN 5 % IV SOLN
INTRAVENOUS | Status: AC
  Filled 2018-04-29: qty 250

## 2018-04-29 MED ORDER — DOCUSATE SODIUM 100 MG PO CAPS
100.0000 mg | ORAL_CAPSULE | Freq: Two times a day (BID) | ORAL | Status: DC
  Administered 2018-04-29 – 2018-05-01 (×4): 100 mg via ORAL
  Filled 2018-04-29 (×4): qty 1

## 2018-04-29 MED ORDER — METOCLOPRAMIDE HCL 5 MG/ML IJ SOLN: 10.0000 mg | Freq: Once | INTRAMUSCULAR | Status: DC | PRN

## 2018-04-29 MED ORDER — ONDANSETRON HCL 4 MG PO TABS: 4.0000 mg | ORAL_TABLET | Freq: Four times a day (QID) | ORAL | Status: DC | PRN

## 2018-04-29 MED ORDER — MEPERIDINE HCL 50 MG/ML IJ SOLN: 6.2500 mg | INTRAMUSCULAR | Status: DC | PRN

## 2018-04-29 MED ORDER — MOMETASONE FURO-FORMOTEROL FUM 200-5 MCG/ACT IN AERO
2.0000 | INHALATION_SPRAY | Freq: Two times a day (BID) | RESPIRATORY_TRACT | Status: DC
Start: 2018-04-29 — End: 2018-05-01
  Administered 2018-04-29 – 2018-05-01 (×4): 2 via RESPIRATORY_TRACT
  Filled 2018-04-29: qty 8.8

## 2018-04-29 MED ORDER — CHLORHEXIDINE GLUCONATE 4 % EX LIQD: 60.0000 mL | Freq: Once | CUTANEOUS | Status: DC

## 2018-04-29 MED ORDER — HYDROMORPHONE HCL 1 MG/ML IJ SOLN
0.5000 mg | INTRAMUSCULAR | Status: DC | PRN
  Administered 2018-04-29 – 2018-04-30 (×2): 1 mg via INTRAVENOUS
  Filled 2018-04-29 (×2): qty 1

## 2018-04-29 MED ORDER — METOCLOPRAMIDE HCL 5 MG/ML IJ SOLN: 5.0000 mg | Freq: Three times a day (TID) | INTRAMUSCULAR | Status: DC | PRN

## 2018-04-29 MED ORDER — ACETAMINOPHEN 10 MG/ML IV SOLN
1000.0000 mg | Freq: Once | INTRAVENOUS | Status: AC
  Administered 2018-04-29: 1000 mg via INTRAVENOUS
  Filled 2018-04-29: qty 100

## 2018-04-29 MED ORDER — SODIUM CHLORIDE 0.9 % IV SOLN
INTRAVENOUS | Status: DC
  Administered 2018-04-29 (×2): via INTRAVENOUS

## 2018-04-29 MED ORDER — CEFAZOLIN SODIUM-DEXTROSE 2-4 GM/100ML-% IV SOLN
2.0000 g | INTRAVENOUS | Status: AC
  Administered 2018-04-29: 2 g via INTRAVENOUS
  Filled 2018-04-29: qty 100

## 2018-04-29 MED ORDER — PHENYLEPHRINE 40 MCG/ML (10ML) SYRINGE FOR IV PUSH (FOR BLOOD PRESSURE SUPPORT)
PREFILLED_SYRINGE | INTRAVENOUS | Status: AC
  Filled 2018-04-29: qty 10

## 2018-04-29 MED ORDER — PROPOFOL 500 MG/50ML IV EMUL
INTRAVENOUS | Status: DC | PRN
  Administered 2018-04-29: 50 ug/kg/min via INTRAVENOUS

## 2018-04-29 MED ORDER — METHOCARBAMOL 500 MG PO TABS
500.0000 mg | ORAL_TABLET | Freq: Four times a day (QID) | ORAL | Status: DC | PRN
  Administered 2018-04-30: 500 mg via ORAL
  Filled 2018-04-29: qty 1

## 2018-04-29 MED ORDER — BUPIVACAINE IN DEXTROSE 0.75-8.25 % IT SOLN
INTRATHECAL | Status: DC | PRN
  Administered 2018-04-29: 1.5 mL via INTRATHECAL

## 2018-04-29 MED ORDER — DEXAMETHASONE SODIUM PHOSPHATE 10 MG/ML IJ SOLN
INTRAMUSCULAR | Status: AC
Start: 2018-04-29 — End: ?
  Filled 2018-04-29: qty 1

## 2018-04-29 MED ORDER — BUPIVACAINE-EPINEPHRINE (PF) 0.25% -1:200000 IJ SOLN
INTRAMUSCULAR | Status: AC
  Filled 2018-04-29: qty 30

## 2018-04-29 MED ORDER — ALBUMIN HUMAN 5 % IV SOLN
INTRAVENOUS | Status: DC | PRN
  Administered 2018-04-29: 14:00:00 via INTRAVENOUS

## 2018-04-29 MED ORDER — TRANEXAMIC ACID 1000 MG/10ML IV SOLN
1000.0000 mg | Freq: Once | INTRAVENOUS | Status: AC
  Administered 2018-04-29: 1000 mg via INTRAVENOUS
  Filled 2018-04-29: qty 1100

## 2018-04-29 MED ORDER — ONDANSETRON HCL 4 MG/2ML IJ SOLN
INTRAMUSCULAR | Status: DC | PRN
  Administered 2018-04-29: 4 mg via INTRAVENOUS

## 2018-04-29 MED ORDER — CEFAZOLIN SODIUM-DEXTROSE 2-4 GM/100ML-% IV SOLN
2.0000 g | Freq: Four times a day (QID) | INTRAVENOUS | Status: AC
  Administered 2018-04-29 – 2018-04-30 (×2): 2 g via INTRAVENOUS
  Filled 2018-04-29 (×2): qty 100

## 2018-04-29 MED ORDER — TRAMADOL HCL 50 MG PO TABS: 50.0000 mg | ORAL_TABLET | Freq: Four times a day (QID) | ORAL | Status: DC | PRN

## 2018-04-29 MED ORDER — LORATADINE 10 MG PO TABS: 10.0000 mg | ORAL_TABLET | Freq: Every day | ORAL | Status: DC | PRN

## 2018-04-29 MED ORDER — ACETAMINOPHEN 500 MG PO TABS
1000.0000 mg | ORAL_TABLET | Freq: Four times a day (QID) | ORAL | Status: AC
  Administered 2018-04-29 – 2018-04-30 (×4): 1000 mg via ORAL
  Filled 2018-04-29 (×4): qty 2

## 2018-04-29 MED ORDER — STERILE WATER FOR IRRIGATION IR SOLN
Status: DC | PRN
  Administered 2018-04-29: 2000 mL

## 2018-04-29 MED ORDER — ACETAMINOPHEN 325 MG PO TABS
325.0000 mg | ORAL_TABLET | Freq: Four times a day (QID) | ORAL | Status: DC | PRN
  Administered 2018-05-01: 650 mg via ORAL
  Filled 2018-04-29: qty 2

## 2018-04-29 MED ORDER — FENTANYL CITRATE (PF) 100 MCG/2ML IJ SOLN
INTRAMUSCULAR | Status: AC
  Filled 2018-04-29: qty 4

## 2018-04-29 MED ORDER — FENTANYL CITRATE (PF) 100 MCG/2ML IJ SOLN
INTRAMUSCULAR | Status: DC | PRN
  Administered 2018-04-29 (×2): 50 ug via INTRAVENOUS

## 2018-04-29 MED ORDER — ALBUTEROL SULFATE (2.5 MG/3ML) 0.083% IN NEBU
3.0000 mL | INHALATION_SOLUTION | RESPIRATORY_TRACT | Status: DC | PRN
  Filled 2018-04-29: qty 3

## 2018-04-29 MED ORDER — 0.9 % SODIUM CHLORIDE (POUR BTL) OPTIME
TOPICAL | Status: DC | PRN
  Administered 2018-04-29: 1000 mL

## 2018-04-29 MED ORDER — MIDAZOLAM HCL 2 MG/2ML IJ SOLN
INTRAMUSCULAR | Status: DC | PRN
  Administered 2018-04-29 (×2): 1 mg via INTRAVENOUS

## 2018-04-29 MED ORDER — ONDANSETRON HCL 4 MG/2ML IJ SOLN
INTRAMUSCULAR | Status: AC
  Filled 2018-04-29: qty 2

## 2018-04-29 MED ORDER — ONDANSETRON HCL 4 MG/2ML IJ SOLN
4.0000 mg | Freq: Four times a day (QID) | INTRAMUSCULAR | Status: DC | PRN
Start: 2018-04-29 — End: 2018-05-01

## 2018-04-29 MED ORDER — POLYETHYLENE GLYCOL 3350 17 G PO PACK
17.0000 g | PACK | Freq: Every day | ORAL | Status: DC | PRN
  Administered 2018-04-30 – 2018-05-01 (×2): 17 g via ORAL
  Filled 2018-04-29 (×2): qty 1

## 2018-04-29 MED ORDER — FENTANYL CITRATE (PF) 100 MCG/2ML IJ SOLN
25.0000 ug | INTRAMUSCULAR | Status: DC | PRN
  Administered 2018-04-29 (×3): 50 ug via INTRAVENOUS

## 2018-04-29 MED ORDER — LACTATED RINGERS IV SOLN: INTRAVENOUS | Status: DC

## 2018-04-29 MED ORDER — DIPHENHYDRAMINE HCL 12.5 MG/5ML PO ELIX
12.5000 mg | ORAL_SOLUTION | ORAL | Status: DC | PRN
  Filled 2018-04-29: qty 10

## 2018-04-29 MED ORDER — PHENYLEPHRINE HCL 10 MG/ML IJ SOLN
INTRAMUSCULAR | Status: AC
  Filled 2018-04-29: qty 1

## 2018-04-29 MED ORDER — DEXAMETHASONE SODIUM PHOSPHATE 10 MG/ML IJ SOLN
10.0000 mg | Freq: Once | INTRAMUSCULAR | Status: AC
  Administered 2018-04-30: 10 mg via INTRAVENOUS
  Filled 2018-04-29: qty 1

## 2018-04-29 MED ORDER — PHENYLEPHRINE 40 MCG/ML (10ML) SYRINGE FOR IV PUSH (FOR BLOOD PRESSURE SUPPORT)
PREFILLED_SYRINGE | INTRAVENOUS | Status: DC | PRN
  Administered 2018-04-29 (×4): 80 ug via INTRAVENOUS
  Administered 2018-04-29 (×2): 40 ug via INTRAVENOUS

## 2018-04-29 MED ORDER — ASPIRIN EC 325 MG PO TBEC
325.0000 mg | DELAYED_RELEASE_TABLET | Freq: Two times a day (BID) | ORAL | Status: DC
  Administered 2018-04-30 – 2018-05-01 (×3): 325 mg via ORAL
  Filled 2018-04-29 (×3): qty 1

## 2018-04-29 MED ORDER — FLEET ENEMA 7-19 GM/118ML RE ENEM: 1.0000 | ENEMA | Freq: Once | RECTAL | Status: DC | PRN

## 2018-04-29 MED ORDER — MIDAZOLAM HCL 2 MG/2ML IJ SOLN
INTRAMUSCULAR | Status: AC
  Filled 2018-04-29: qty 2

## 2018-04-29 MED ORDER — DEXAMETHASONE SODIUM PHOSPHATE 10 MG/ML IJ SOLN
10.0000 mg | Freq: Once | INTRAMUSCULAR | Status: AC
  Administered 2018-04-29: 10 mg via INTRAVENOUS

## 2018-04-29 MED ORDER — PHENOL 1.4 % MT LIQD: 1.0000 | OROMUCOSAL | Status: DC | PRN

## 2018-04-29 MED ORDER — OXYCODONE HCL 5 MG PO TABS
10.0000 mg | ORAL_TABLET | ORAL | Status: DC | PRN
  Administered 2018-04-30: 10 mg via ORAL
  Filled 2018-04-29 (×2): qty 2
  Filled 2018-04-29: qty 3

## 2018-04-29 MED ORDER — PROPOFOL 10 MG/ML IV BOLUS
INTRAVENOUS | Status: AC
  Filled 2018-04-29: qty 40

## 2018-04-29 SURGICAL SUPPLY — 45 items
BAG DECANTER FOR FLEXI CONT (MISCELLANEOUS) ×1 IMPLANT
BAG SPEC THK2 15X12 ZIP CLS (MISCELLANEOUS)
BAG ZIPLOCK 12X15 (MISCELLANEOUS) IMPLANT
BLADE EXTENDED COATED 6.5IN (ELECTRODE) ×3 IMPLANT
BLADE SAG 18X100X1.27 (BLADE) ×3 IMPLANT
CAPT HIP TOTAL 2 ×2 IMPLANT
CLOSURE WOUND 1/2 X4 (GAUZE/BANDAGES/DRESSINGS) ×1
CLOTH BEACON ORANGE TIMEOUT ST (SAFETY) ×3 IMPLANT
COVER PERINEAL POST (MISCELLANEOUS) ×3 IMPLANT
COVER SURGICAL LIGHT HANDLE (MISCELLANEOUS) ×3 IMPLANT
DECANTER SPIKE VIAL GLASS SM (MISCELLANEOUS) ×3 IMPLANT
DRAPE STERI IOBAN 125X83 (DRAPES) ×3 IMPLANT
DRAPE U-SHAPE 47X51 STRL (DRAPES) ×6 IMPLANT
DRSG ADAPTIC 3X8 NADH LF (GAUZE/BANDAGES/DRESSINGS) ×3 IMPLANT
DRSG MEPILEX BORDER 4X4 (GAUZE/BANDAGES/DRESSINGS) ×3 IMPLANT
DRSG MEPILEX BORDER 4X8 (GAUZE/BANDAGES/DRESSINGS) ×3 IMPLANT
DURAPREP 26ML APPLICATOR (WOUND CARE) ×3 IMPLANT
ELECT REM PT RETURN 15FT ADLT (MISCELLANEOUS) ×3 IMPLANT
EVACUATOR 1/8 PVC DRAIN (DRAIN) ×3 IMPLANT
GLOVE BIO SURGEON STRL SZ7 (GLOVE) ×2 IMPLANT
GLOVE BIO SURGEON STRL SZ8 (GLOVE) ×6 IMPLANT
GLOVE BIOGEL PI IND STRL 6.5 (GLOVE) ×1 IMPLANT
GLOVE BIOGEL PI IND STRL 7.0 (GLOVE) IMPLANT
GLOVE BIOGEL PI IND STRL 8 (GLOVE) ×2 IMPLANT
GLOVE BIOGEL PI INDICATOR 6.5 (GLOVE) ×2
GLOVE BIOGEL PI INDICATOR 7.0 (GLOVE) ×8
GLOVE BIOGEL PI INDICATOR 8 (GLOVE) ×4
GLOVE SURG SS PI 6.5 STRL IVOR (GLOVE) ×3 IMPLANT
GLOVE SURG SS PI 7.0 STRL IVOR (GLOVE) ×4 IMPLANT
GOWN SPEC L3 XXLG W/TWL (GOWN DISPOSABLE) ×2 IMPLANT
GOWN STRL REUS W/ TWL LRG LVL3 (GOWN DISPOSABLE) IMPLANT
GOWN STRL REUS W/TWL LRG LVL3 (GOWN DISPOSABLE) ×8 IMPLANT
GOWN STRL REUS W/TWL XL LVL3 (GOWN DISPOSABLE) ×3 IMPLANT
PACK ANTERIOR HIP CUSTOM (KITS) ×3 IMPLANT
STRIP CLOSURE SKIN 1/2X4 (GAUZE/BANDAGES/DRESSINGS) ×2 IMPLANT
SUT ETHIBOND NAB CT1 #1 30IN (SUTURE) ×3 IMPLANT
SUT MNCRL AB 4-0 PS2 18 (SUTURE) ×3 IMPLANT
SUT STRATAFIX 0 PDS 27 VIOLET (SUTURE) ×6
SUT VIC AB 2-0 CT1 27 (SUTURE) ×6
SUT VIC AB 2-0 CT1 TAPERPNT 27 (SUTURE) ×2 IMPLANT
SUTURE STRATFX 0 PDS 27 VIOLET (SUTURE) ×1 IMPLANT
SYR 50ML LL SCALE MARK (SYRINGE) IMPLANT
TRAY FOLEY CATH 14FRSI W/METER (CATHETERS) ×2 IMPLANT
TRAY FOLEY MTR SLVR 16FR STAT (SET/KITS/TRAYS/PACK) ×1 IMPLANT
YANKAUER SUCT BULB TIP 10FT TU (MISCELLANEOUS) ×3 IMPLANT

## 2018-04-29 NOTE — Anesthesia Procedure Notes (Signed)
Performed by: Sanjna Haskew, CRNA       

## 2018-04-29 NOTE — Anesthesia Postprocedure Evaluation (Signed)
Anesthesia Post Note  Patient: Michele Thornton  Procedure(s) Performed: LEFT TOTAL HIP ARTHROPLASTY ANTERIOR APPROACH (Left Hip)     Patient location during evaluation: PACU Anesthesia Type: Spinal Level of consciousness: awake and alert Pain management: pain level controlled Vital Signs Assessment: post-procedure vital signs reviewed and stable Respiratory status: spontaneous breathing, nonlabored ventilation, respiratory function stable and patient connected to nasal cannula oxygen Cardiovascular status: blood pressure returned to baseline and stable Postop Assessment: no apparent nausea or vomiting and spinal receding Anesthetic complications: no    Last Vitals:  Vitals:   04/29/18 1625 04/29/18 1640  BP: (!) 111/98 99/73  Pulse: 86 83  Resp: 13   Temp: 36.4 C 36.5 C  SpO2: 98% 98%    Last Pain:  Vitals:   04/29/18 1640  TempSrc: Oral  PainSc: 0-No pain                 Phillips Grout

## 2018-04-29 NOTE — Anesthesia Procedure Notes (Signed)
Spinal  Patient location during procedure: OR Staffing Anesthesiologist: Wynell Halberg, MD Performed: anesthesiologist  Preanesthetic Checklist Completed: patient identified, site marked, surgical consent, pre-op evaluation, timeout performed, IV checked, risks and benefits discussed and monitors and equipment checked Spinal Block Patient position: sitting Prep: DuraPrep Patient monitoring: heart rate, continuous pulse ox and blood pressure Approach: right paramedian Location: L3-4 Injection technique: single-shot Needle Needle type: Sprotte  Needle gauge: 24 G Needle length: 9 cm Additional Notes Expiration date of kit checked and confirmed. Patient tolerated procedure well, without complications.       

## 2018-04-29 NOTE — Anesthesia Procedure Notes (Signed)
Procedure Name: MAC Date/Time: 04/29/2018 1:34 PM Performed by: Dione Booze, CRNA Pre-anesthesia Checklist: Patient identified, Emergency Drugs available, Suction available, Patient being monitored and Timeout performed Patient Re-evaluated:Patient Re-evaluated prior to induction Oxygen Delivery Method: Simple face mask Placement Confirmation: positive ETCO2

## 2018-04-29 NOTE — Anesthesia Preprocedure Evaluation (Signed)
Anesthesia Evaluation  Patient identified by MRN, date of birth, ID band Patient awake    Reviewed: Allergy & Precautions, NPO status , Patient's Chart, lab work & pertinent test results  Airway Mallampati: I  TM Distance: >3 FB Neck ROM: Full    Dental no notable dental hx. (+) Teeth Intact, Dental Advisory Given   Pulmonary asthma , former smoker,    Pulmonary exam normal breath sounds clear to auscultation       Cardiovascular + Peripheral Vascular Disease  Normal cardiovascular exam Rhythm:Regular Rate:Normal     Neuro/Psych negative neurological ROS  negative psych ROS   GI/Hepatic negative GI ROS, Neg liver ROS,   Endo/Other  negative endocrine ROS  Renal/GU negative Renal ROS  negative genitourinary   Musculoskeletal  (+) Arthritis , Osteoarthritis,    Abdominal   Peds negative pediatric ROS (+)  Hematology negative hematology ROS (+)   Anesthesia Other Findings   Reproductive/Obstetrics negative OB ROS                             Lab Results  Component Value Date   WBC 6.1 04/22/2018   HGB 13.5 04/22/2018   HCT 42.3 04/22/2018   MCV 92.8 04/22/2018   PLT 239 04/22/2018   Lab Results  Component Value Date   CREATININE 0.71 04/22/2018   BUN 16 04/22/2018   NA 143 04/22/2018   K 4.4 04/22/2018   CL 105 04/22/2018   CO2 25 04/22/2018   Lab Results  Component Value Date   INR 0.90 04/22/2018   INR 0.93 03/13/2017     Anesthesia Physical  Anesthesia Plan  ASA: II  Anesthesia Plan: Spinal   Post-op Pain Management:    Induction: Intravenous  PONV Risk Score and Plan: 2 and Ondansetron and Treatment may vary due to age or medical condition  Airway Management Planned: Simple Face Mask  Additional Equipment:   Intra-op Plan:   Post-operative Plan:   Informed Consent: I have reviewed the patients History and Physical, chart, labs and discussed the  procedure including the risks, benefits and alternatives for the proposed anesthesia with the patient or authorized representative who has indicated his/her understanding and acceptance.   Dental advisory given  Plan Discussed with: CRNA  Anesthesia Plan Comments:         Anesthesia Quick Evaluation

## 2018-04-29 NOTE — Transfer of Care (Signed)
Immediate Anesthesia Transfer of Care Note  Patient: Michele Thornton  Procedure(s) Performed: Procedure(s): LEFT TOTAL HIP ARTHROPLASTY ANTERIOR APPROACH (Left)  Patient Location: PACU  Anesthesia Type:Spinal  Level of Consciousness:  sedated, patient cooperative and responds to stimulation  Airway & Oxygen Therapy:Patient Spontanous Breathing and Patient connected to face mask oxgen  Post-op Assessment:  Report given to PACU RN and Post -op Vital signs reviewed and stable  Post vital signs:  Reviewed and stable  Last Vitals:  Vitals:   04/29/18 1216  BP: (!) 116/91  Pulse: 76  Resp: 16  Temp: 36.6 C  SpO2: 97%    Complications: No apparent anesthesia complications

## 2018-04-29 NOTE — Op Note (Signed)
OPERATIVE REPORT- TOTAL HIP ARTHROPLASTY   PREOPERATIVE DIAGNOSIS: Osteoarthritis of the Left hip.   POSTOPERATIVE DIAGNOSIS: Osteoarthritis of the Left  hip.   PROCEDURE: Left total hip arthroplasty, anterior approach.   SURGEON: Ollen Gross, MD   ASSISTANT: Leilani Able, PA-C  ANESTHESIA:  Spinal  ESTIMATED BLOOD LOSS:-650 mL    DRAINS: Hemovac x1.   COMPLICATIONS: None   CONDITION: PACU - hemodynamically stable.   BRIEF CLINICAL NOTE: Michele Thornton is a 67 y.o. female who has advanced end-  stage arthritis of their Left  hip with progressively worsening pain and  dysfunction.The patient has failed nonoperative management and presents for  total hip arthroplasty.   PROCEDURE IN DETAIL: After successful administration of spinal  anesthetic, the traction boots for the [facility] bed were placed on both  feet and the patient was placed onto the [facility] bed, boots placed into the leg  holders. The Left hip was then isolated from the perineum with plastic  drapes and prepped and draped in the usual sterile fashion. ASIS and  greater trochanter were marked and a oblique incision was made, starting  at about 1 cm lateral and 2 cm distal to the ASIS and coursing towards  the anterior cortex of the femur. The skin was cut with a 10 blade  through subcutaneous tissue to the level of the fascia overlying the  tensor fascia lata muscle. The fascia was then incised in line with the  incision at the junction of the anterior third and posterior 2/3rd. The  muscle was teased off the fascia and then the interval between the TFL  and the rectus was developed. The Hohmann retractor was then placed at  the top of the femoral neck over the capsule. The vessels overlying the  capsule were cauterized and the fat on top of the capsule was removed.  A Hohmann retractor was then placed anterior underneath the rectus  femoris to give exposure to the entire anterior capsule. A T-shaped   capsulotomy was performed. The edges were tagged and the femoral head  was identified.       Osteophytes are removed off the superior acetabulum.  The femoral neck was then cut in situ with an oscillating saw. Traction  was then applied to the left lower extremity utilizing the [facility]  traction. The femoral head was then removed. Retractors were placed  around the acetabulum and then circumferential removal of the labrum was  performed. Osteophytes were also removed. Reaming starts at 45 mm to  medialize and  Increased in 2 mm increments to 47 mm. We reamed in  approximately 40 degrees of abduction, 20 degrees anteversion. A 48 mm  pinnacle acetabular shell was then impacted in anatomic position under  fluoroscopic guidance with excellent purchase. We did not need to place  any additional dome screws. A 28 mm neutral + 4 marathon liner was then  placed into the acetabular shell.       The femoral lift was then placed along the lateral aspect of the femur  just distal to the vastus ridge. The leg was  externally rotated and capsule  was stripped off the inferior aspect of the femoral neck down to the  level of the lesser trochanter, this was done with electrocautery. The femur was lifted after this was performed. The  leg was then placed in an extended and adducted position essentially delivering the femur. We also removed the capsule superiorly and the piriformis from the piriformis  fossa to gain excellent exposure of the  proximal femur. Rongeur was used to remove some cancellous bone to get  into the lateral portion of the proximal femur for placement of the  initial starter reamer. The starter broaches was placed  the starter broach  and was shown to go down the center of the canal. Broaching  with the Actis system was then performed starting at size 8, coursing  Up to size 3. A size 3 had excellent torsional and rotational  and axial stability. The trial standard offset neck was then  placed  with a 28 + 5 trial head. The hip was then reduced. We confirmed that  the stem was in the canal both on AP and lateral x-rays. It also has excellent sizing. The hip was reduced with outstanding stability through full extension and full external rotation.. AP pelvis was taken and the leg lengths were measured and found to be equal. Hip was then dislocated again and the femoral head and neck removed. The  femoral broach was removed. Size 3 Actis stem with a standard offset  neck was then impacted into the femur following native anteversion. Has  excellent purchase in the canal. Excellent torsional and rotational and  axial stability. It is confirmed to be in the canal on AP and lateral  fluoroscopic views. The 28 + 5 ceramic head was placed and the hip  reduced with outstanding stability. Again AP pelvis was taken and it  confirmed that the leg lengths were equal. The wound was then copiously  irrigated with saline solution and the capsule reattached and repaired  with Ethibond suture. 30 ml of .25% Bupivicaine was  injected into the capsule and into the edge of the tensor fascia lata as well as subcutaneous tissue. The fascia overlying the tensor fascia lata was then closed with a running #1 V-Loc. Subcu was closed with interrupted 2-0 Vicryl and subcuticular running 4-0 Monocryl. Incision was cleaned  and dried. Steri-Strips and a bulky sterile dressing applied. Hemovac  drain was hooked to suction and then the patient was awakened and transported to  recovery in stable condition.        Please note that a surgical assistant was a medical necessity for this procedure to perform it in a safe and expeditious manner. Assistant was necessary to provide appropriate retraction of vital neurovascular structures and to prevent femoral fracture and allow for anatomic placement of the prosthesis.  Ollen Gross, M.D.

## 2018-04-30 ENCOUNTER — Encounter (HOSPITAL_COMMUNITY): Payer: Self-pay | Admitting: *Deleted

## 2018-04-30 LAB — CBC
HEMATOCRIT: 33.4 % — AB (ref 36.0–46.0)
Hemoglobin: 10.6 g/dL — ABNORMAL LOW (ref 12.0–15.0)
MCH: 28.7 pg (ref 26.0–34.0)
MCHC: 31.7 g/dL (ref 30.0–36.0)
MCV: 90.5 fL (ref 78.0–100.0)
PLATELETS: 232 10*3/uL (ref 150–400)
RBC: 3.69 MIL/uL — ABNORMAL LOW (ref 3.87–5.11)
RDW: 14 % (ref 11.5–15.5)
WBC: 12.3 10*3/uL — AB (ref 4.0–10.5)

## 2018-04-30 LAB — BASIC METABOLIC PANEL
ANION GAP: 9 (ref 5–15)
BUN: 14 mg/dL (ref 6–20)
CALCIUM: 8.4 mg/dL — AB (ref 8.9–10.3)
CO2: 25 mmol/L (ref 22–32)
Chloride: 106 mmol/L (ref 101–111)
Creatinine, Ser: 0.8 mg/dL (ref 0.44–1.00)
GFR calc Af Amer: >60 mL/min (ref >60)
GLUCOSE: 184 mg/dL — AB (ref 65–99)
Potassium: 4.1 mmol/L (ref 3.5–5.1)
Sodium: 140 mmol/L (ref 135–145)

## 2018-04-30 MED ORDER — ASPIRIN 325 MG PO TBEC: 325.0000 mg | DELAYED_RELEASE_TABLET | Freq: Two times a day (BID) | ORAL | 0 refills | Status: AC

## 2018-04-30 MED ORDER — METHOCARBAMOL 500 MG PO TABS: 500.0000 mg | ORAL_TABLET | Freq: Four times a day (QID) | ORAL | 0 refills | Status: DC | PRN

## 2018-04-30 MED ORDER — TRAMADOL HCL 50 MG PO TABS: 50.0000 mg | ORAL_TABLET | Freq: Four times a day (QID) | ORAL | 0 refills | Status: DC | PRN

## 2018-04-30 MED ORDER — OXYCODONE HCL 5 MG PO TABS: 5.0000 mg | ORAL_TABLET | Freq: Four times a day (QID) | ORAL | 0 refills | Status: DC | PRN

## 2018-04-30 NOTE — Progress Notes (Signed)
Physical Therapy Treatment Patient Details Name: Michele Thornton MRN: [id number] DOB: Apr 07, 1951 Today's Date: 04/30/2018    History of Present Illness Pt s/p L THR and with hx of R THR    PT Comments    Marked improvement in activity tolerance and pain control.   Follow Up Recommendations  Follow surgeon's recommendation for DC plan and follow-up therapies     Equipment Recommendations  None recommended by PT    Recommendations for Other Services       Precautions / Restrictions Precautions Precautions: Fall Restrictions Weight Bearing Restrictions: No Other Position/Activity Restrictions: WBAT    Mobility  Bed Mobility Overal bed mobility: Needs Assistance Bed Mobility: Sit to Supine       Sit to supine: Min assist   General bed mobility comments: cues for sequence and use of R LE to self assist  Transfers Overall transfer level: Needs assistance Equipment used: Rolling walker (2 wheeled) Transfers: Sit to/from Stand Sit to Stand: Min assist;From elevated surface         General transfer comment: cues for LE management and use of UEs to self assist  Ambulation/Gait Ambulation/Gait assistance: Min assist Ambulation Distance (Feet): 95 Feet Assistive device: Rolling walker (2 wheeled) Gait Pattern/deviations: Step-to pattern;Decreased step length - right;Decreased step length - left;Decreased stance time - left;Shuffle;Trunk flexed Gait velocity: decr   General Gait Details: cues for sequence, posture and position from RW.  Improvement noted in pt ability to WB on L LE   Stairs             Wheelchair Mobility    Modified Rankin (Stroke Patients Only)       Balance Overall balance assessment: Needs assistance Sitting-balance support: No upper extremity supported;Feet supported Sitting balance-Leahy Scale: Good     Standing balance support: Bilateral upper extremity supported Standing balance-Leahy Scale: Fair                              Cognition Arousal/Alertness: Awake/alert Behavior During Therapy: WFL for tasks assessed/performed Overall Cognitive Status: Within Functional Limits for tasks assessed                                        Exercises Total Joint Exercises Ankle Circles/Pumps: AROM;Both;15 reps;Supine Quad Sets: AROM;Both;10 reps;Supine Heel Slides: AAROM;Left;20 reps;Supine Hip ABduction/ADduction: AAROM;Left;15 reps;Supine    General Comments        Pertinent Vitals/Pain Pain Assessment: 0-10 Pain Score: 2  Pain Location: L hip Pain Descriptors / Indicators: Aching;Sore;Other (Comment) Pain Intervention(s): Limited activity within patient's tolerance;Monitored during session;Premedicated before session;Ice applied    Home Living                      Prior Function            PT Goals (current goals can now be found in the care plan section) Acute Rehab PT Goals Patient Stated Goal: Less pain PT Goal Formulation: With patient Time For Goal Achievement: 05/07/18 Potential to Achieve Goals: Good Progress towards PT goals: Progressing toward goals    Frequency    7X/week      PT Plan Current plan remains appropriate    Co-evaluation              AM-PAC PT "6 Clicks" Daily Activity  Outcome Measure  Difficulty turning over  in bed (including adjusting bedclothes, sheets and blankets)?: Unable Difficulty moving from lying on back to sitting on the side of the bed? : Unable Difficulty sitting down on and standing up from a chair with arms (e.g., wheelchair, bedside commode, etc,.)?: Unable Help needed moving to and from a bed to chair (including a wheelchair)?: A Little Help needed walking in hospital room?: A Little Help needed climbing 3-5 steps with a railing? : A Little 6 Click Score: 12    End of Session Equipment Utilized During Treatment: Gait belt Activity Tolerance: Patient tolerated treatment well Patient left: in  bed;with call bell/phone within reach Nurse Communication: Mobility status PT Visit Diagnosis: Difficulty in walking, not elsewhere classified (R26.2);Pain Pain - Right/Left: Left Pain - part of body: Hip     Time: 1610-9604 PT Time Calculation (min) (ACUTE ONLY): 32 min  Charges:  $Gait Training: 8-22 mins $Therapeutic Exercise: 8-22 mins                    G Codes:       Pg [phone]    Wisam Siefring 04/30/2018, 3:02 PM

## 2018-04-30 NOTE — Progress Notes (Signed)
Discharge planning, no HH needs identified. Plan for HEP, has DME. [phone] 

## 2018-04-30 NOTE — Progress Notes (Signed)
   Subjective: 1 Day Post-Op Procedure(s) (LRB): LEFT TOTAL HIP ARTHROPLASTY ANTERIOR APPROACH (Left) Patient reports pain as moderate.   Patient seen in rounds with Dr. Lequita Halt. Patient is well, and has had no acute complaints or problems other than pain in the left hip. No SOB or chest pain. No issues overnight. Foley catheter just removed.  Plan is to go Home after hospital stay.  Objective: Vital signs in last 24 hours: Temp:  [97.5 F (36.4 C)-98.2 F (36.8 C)] 97.5 F (36.4 C) (05/23 0507) Pulse Rate:  [76-92] 90 (05/23 0507) Resp:  [10-17] 14 (05/23 0507) BP: (96-125)/(66-98) 102/72 (05/23 0507) SpO2:  [93 %-100 %] 93 % (05/23 0507) Weight:  [80.7 kg (178 lb)] 80.7 kg (178 lb) (05/22 1216)  Intake/Output from previous day:  Intake/Output Summary (Last 24 hours) at 04/30/2018 0728 Last data filed at 04/30/2018 0507 Gross per 24 hour  Intake 3616.25 ml  Output 2110 ml  Net 1506.25 ml     Labs: Recent Labs    04/30/18 0538  HGB 10.6*   Recent Labs    04/30/18 0538  WBC 12.3*  RBC 3.69*  HCT 33.4*  PLT 232   Recent Labs    04/30/18 0538  NA 140  K 4.1  CL 106  CO2 25  BUN 14  CREATININE 0.80  GLUCOSE 184*  CALCIUM 8.4*    EXAM General - Patient is Alert and Oriented Extremity - Neurologically intact Intact pulses distally Dorsiflexion/Plantar flexion intact No cellulitis present Compartment soft Dressing - moderate drainage from hemovac site once pulled. Reinforced with gauze dressing.  Motor Function - intact, moving foot and toes well on exam.  Hemovac pulled without difficulty.  Past Medical History:  Diagnosis Date  . Allergy   . Asthma   . Incisional hernia    LEFT FROM  CHILDHOOD UMBLICAL HERNIA REPAIR   . Pneumonia    OVER 10 YEARS AGO   . Umbilical hernia    childhood  . Varicose veins     Assessment/Plan: 1 Day Post-Op Procedure(s) (LRB): LEFT TOTAL HIP ARTHROPLASTY ANTERIOR APPROACH (Left) Active Problems:   OA  (osteoarthritis) of hip  Estimated body mass index is 33.63 kg/m as calculated from the following:   Height as of this encounter:  (1.549 m).   Weight as of this encounter: 80.7 kg (178 lb). Advance diet Up with therapy D/C IV fluids when tolerating POs well  DVT Prophylaxis - Aspirin Weight Bearing As Tolerated  Hemovac Pulled Begin Therapy  Will have her work with therapy today. Possible DC home today if she progresses well with therapy. More likely will need to hold DC until tomorrow. Will communicate with RN about progress as the day goes on.   Dimitri Ped, PA-C Orthopaedic Surgery 04/30/2018, 7:28 AM

## 2018-04-30 NOTE — Evaluation (Signed)
Physical Therapy Evaluation Patient Details Name: Michele Thornton MRN: [id number] DOB: Jul 19, 1951 Today's Date: 04/30/2018   History of Present Illness  Pt s/p L THR and with hx of R THR  Clinical Impression  Pt s/p L THR and presents with decreased L LE strength/ROM and post op pain limiting functional mobility.  Pt should progress to dc home with family assist.    Follow Up Recommendations Follow surgeon's recommendation for DC plan and follow-up therapies    Equipment Recommendations  None recommended by PT    Recommendations for Other Services       Precautions / Restrictions Precautions Precautions: Fall Restrictions Weight Bearing Restrictions: No Other Position/Activity Restrictions: WBAT      Mobility  Bed Mobility Overal bed mobility: Needs Assistance Bed Mobility: Supine to Sit     Supine to sit: Min assist;Mod assist     General bed mobility comments: cues for sequence and use of R LE to self assist  Transfers Overall transfer level: Needs assistance Equipment used: Rolling walker (2 wheeled) Transfers: Sit to/from Stand Sit to Stand: Min assist;From elevated surface         General transfer comment: cues for LE management and use of UEs to self assist  Ambulation/Gait Ambulation/Gait assistance: Min assist Ambulation Distance (Feet): 11 Feet Assistive device: Rolling walker (2 wheeled) Gait Pattern/deviations: Step-to pattern;Decreased step length - right;Decreased step length - left;Decreased stance time - left;Shuffle;Trunk flexed Gait velocity: decr   General Gait Details: cues for sequence, posture and position from RW.  Pt struggling for heel contact on L 2* increased pain.  Distance ltd by pain  Stairs            Wheelchair Mobility    Modified Rankin (Stroke Patients Only)       Balance Overall balance assessment: Needs assistance Sitting-balance support: No upper extremity supported;Feet supported Sitting balance-Leahy  Scale: Good     Standing balance support: Bilateral upper extremity supported Standing balance-Leahy Scale: Poor                               Pertinent Vitals/Pain Pain Assessment: 0-10 Pain Score: 8  Pain Location: L hip Pain Descriptors / Indicators: Aching;Sore;Other (Comment)(stinging) Pain Intervention(s): Limited activity within patient's tolerance;Monitored during session;Premedicated before session;Patient requesting pain meds-RN notified;Ice applied    Home Living Family/patient expects to be discharged to:: Private residence Living Arrangements: Spouse/significant other Available Help at Discharge: Family Type of Home: House Home Access: Ramped entrance     Home Layout: One level Home Equipment: Environmental consultant - 2 wheels;Cane - single point;Bedside commode      Prior Function Level of Independence: Independent               Hand Dominance   Dominant Hand: Right    Extremity/Trunk Assessment   Upper Extremity Assessment Upper Extremity Assessment: Overall WFL for tasks assessed    Lower Extremity Assessment Lower Extremity Assessment: LLE deficits/detail LLE Deficits / Details: Strength at hip 2/5 with AAROM to 80 flex and 10 abd       Communication   Communication: No difficulties  Cognition Arousal/Alertness: Awake/alert Behavior During Therapy: WFL for tasks assessed/performed Overall Cognitive Status: Within Functional Limits for tasks assessed  General Comments      Exercises Total Joint Exercises Ankle Circles/Pumps: AROM;Both;15 reps;Supine Quad Sets: AROM;Both;10 reps;Supine Heel Slides: AAROM;Left;20 reps;Supine Hip ABduction/ADduction: AAROM;Left;15 reps;Supine   Assessment/Plan    PT Assessment Patient needs continued PT services  PT Problem List Decreased strength;Decreased range of motion;Decreased activity tolerance;Decreased mobility;Decreased balance;Decreased  knowledge of use of DME;Pain;Obesity       PT Treatment Interventions DME instruction;Gait training;Stair training;Functional mobility training;Therapeutic activities;Therapeutic exercise;Patient/family education    PT Goals (Current goals can be found in the Care Plan section)  Acute Rehab PT Goals Patient Stated Goal: Less pain PT Goal Formulation: With patient Time For Goal Achievement: 05/07/18 Potential to Achieve Goals: Good    Frequency 7X/week   Barriers to discharge        Co-evaluation               AM-PAC PT "6 Clicks" Daily Activity  Outcome Measure Difficulty turning over in bed (including adjusting bedclothes, sheets and blankets)?: Unable Difficulty moving from lying on back to sitting on the side of the bed? : Unable Difficulty sitting down on and standing up from a chair with arms (e.g., wheelchair, bedside commode, etc,.)?: Unable Help needed moving to and from a bed to chair (including a wheelchair)?: A Little Help needed walking in hospital room?: A Little Help needed climbing 3-5 steps with a railing? : A Lot 6 Click Score: 11    End of Session Equipment Utilized During Treatment: Gait belt Activity Tolerance: Patient limited by pain Patient left: in chair;with call bell/phone within reach;with family/visitor present Nurse Communication: Mobility status;Patient requests pain meds PT Visit Diagnosis: Difficulty in walking, not elsewhere classified (R26.2);Pain Pain - Right/Left: Left Pain - part of body: Hip    Time: 4034-7425 PT Time Calculation (min) (ACUTE ONLY): 36 min   Charges:   PT Evaluation $PT Eval Low Complexity: 1 Low PT Treatments $Therapeutic Exercise: 8-22 mins   PT G Codes:        Pg [phone]   Amariss Detamore 04/30/2018, 10:25 AM

## 2018-05-01 LAB — CBC
HEMATOCRIT: 29.2 % — AB (ref 36.0–46.0)
HEMOGLOBIN: 9.2 g/dL — AB (ref 12.0–15.0)
MCH: 28.9 pg (ref 26.0–34.0)
MCHC: 31.5 g/dL (ref 30.0–36.0)
MCV: 91.8 fL (ref 78.0–100.0)
Platelets: 216 10*3/uL (ref 150–400)
RBC: 3.18 MIL/uL — ABNORMAL LOW (ref 3.87–5.11)
RDW: 14.4 % (ref 11.5–15.5)
WBC: 14.6 10*3/uL — ABNORMAL HIGH (ref 4.0–10.5)

## 2018-05-01 LAB — BASIC METABOLIC PANEL
Anion gap: 9 (ref 5–15)
BUN: 24 mg/dL — AB (ref 6–20)
CHLORIDE: 107 mmol/L (ref 101–111)
CO2: 26 mmol/L (ref 22–32)
CREATININE: 0.81 mg/dL (ref 0.44–1.00)
Calcium: 8.3 mg/dL — ABNORMAL LOW (ref 8.9–10.3)
GFR calc Af Amer: >60 mL/min (ref >60)
GFR calc non Af Amer: >60 mL/min (ref >60)
Glucose, Bld: 160 mg/dL — ABNORMAL HIGH (ref 65–99)
Potassium: 4.3 mmol/L (ref 3.5–5.1)
SODIUM: 142 mmol/L (ref 135–145)

## 2018-05-01 NOTE — Progress Notes (Signed)
Physical Therapy Treatment Patient Details Name: Michele Thornton MRN: [id number] DOB: Apr 21, 1951 Today's Date: 05/01/2018    History of Present Illness Pt s/p L THR and with hx of R THR    PT Comments    Pt progressing well with mobility.  Spouse present to review stairs, car transfers and home therex program with written instruction and progression.   Follow Up Recommendations  Follow surgeon's recommendation for DC plan and follow-up therapies     Equipment Recommendations  None recommended by PT    Recommendations for Other Services       Precautions / Restrictions Precautions Precautions: Fall Restrictions Weight Bearing Restrictions: No Other Position/Activity Restrictions: WBAT    Mobility  Bed Mobility               General bed mobility comments: Pt up in chair and requests back to same  Transfers Overall transfer level: Needs assistance Equipment used: Rolling walker (2 wheeled) Transfers: Sit to/from Stand Sit to Stand: Min guard         General transfer comment: cues for LE management and use of UEs to self assist  Ambulation/Gait Ambulation/Gait assistance: Min guard;Supervision Ambulation Distance (Feet): 150 Feet Assistive device: Rolling walker (2 wheeled) Gait Pattern/deviations: Decreased step length - right;Decreased step length - left;Decreased stance time - left;Shuffle;Trunk flexed;Step-to pattern;Step-through pattern Gait velocity: decr   General Gait Details: cues for sequence, posture and position from RW.     Stairs Stairs: Yes Stairs assistance: Min assist Stair Management: No rails;Step to pattern;Forwards;With walker Number of Stairs: 2 General stair comments: single step twice    Wheelchair Mobility    Modified Rankin (Stroke Patients Only)       Balance Overall balance assessment: Needs assistance Sitting-balance support: No upper extremity supported;Feet supported Sitting balance-Leahy Scale: Good      Standing balance support: Bilateral upper extremity supported Standing balance-Leahy Scale: Fair                              Cognition Arousal/Alertness: Awake/alert Behavior During Therapy: WFL for tasks assessed/performed Overall Cognitive Status: Within Functional Limits for tasks assessed                                        Exercises Total Joint Exercises Ankle Circles/Pumps: AROM;Both;15 reps;Supine Quad Sets: AROM;Both;10 reps;Supine Heel Slides: AAROM;Left;20 reps;Supine Hip ABduction/ADduction: AAROM;Left;15 reps;Supine Long Arc Quad: AROM;Left;10 reps;Seated    General Comments        Pertinent Vitals/Pain Pain Assessment: 0-10 Pain Score: 4  Pain Location: L hip Pain Descriptors / Indicators: Aching;Sore;Other (Comment) Pain Intervention(s): Limited activity within patient's tolerance;Monitored during session;Premedicated before session    Home Living                      Prior Function            PT Goals (current goals can now be found in the care plan section) Acute Rehab PT Goals Patient Stated Goal: Regain IND PT Goal Formulation: With patient Time For Goal Achievement: 05/07/18 Potential to Achieve Goals: Good Progress towards PT goals: Progressing toward goals    Frequency    7X/week      PT Plan Current plan remains appropriate    Co-evaluation              AM-PAC  PT "6 Clicks" Daily Activity  Outcome Measure  Difficulty turning over in bed (including adjusting bedclothes, sheets and blankets)?: Unable Difficulty moving from lying on back to sitting on the side of the bed? : Unable Difficulty sitting down on and standing up from a chair with arms (e.g., wheelchair, bedside commode, etc,.)?: A Lot Help needed moving to and from a bed to chair (including a wheelchair)?: A Little Help needed walking in hospital room?: A Little Help needed climbing 3-5 steps with a railing? : A Little 6  Click Score: 13    End of Session Equipment Utilized During Treatment: Gait belt Activity Tolerance: Patient tolerated treatment well Patient left: with call bell/phone within reach;in chair;with family/visitor present Nurse Communication: Mobility status PT Visit Diagnosis: Difficulty in walking, not elsewhere classified (R26.2);Pain Pain - Right/Left: Left Pain - part of body: Hip     Time: 0925-1000 PT Time Calculation (min) (ACUTE ONLY): 35 min  Charges:  $Gait Training: 8-22 mins $Therapeutic Exercise: 8-22 mins                    G Codes:       Pg [phone]    Kyaire Gruenewald 05/01/2018, 3:02 PM

## 2018-05-01 NOTE — Progress Notes (Signed)
   Subjective: 2 Days Post-Op Procedure(s) (LRB): LEFT TOTAL HIP ARTHROPLASTY ANTERIOR APPROACH (Left) Patient reports pain as mild.   Patient seen in rounds with Dr. Lequita Halt. Patient is well, and has had no acute complaints or problems. No issues overnight. No SOB or chest pain. Voiding. Positive flatus.  We will resume therapy today.  Plan is to go Home after hospital stay.  Objective: Vital signs in last 24 hours: Temp:  [97.8 F (36.6 C)-98.4 F (36.9 C)] 97.8 F (36.6 C) (05/24 0655) Pulse Rate:  [86-98] 94 (05/24 0655) Resp:  [12-17] 17 (05/24 0655) BP: (104-120)/(66-77) 117/70 (05/24 0655) SpO2:  [94 %-97 %] 95 % (05/24 0655)  Intake/Output from previous day:  Intake/Output Summary (Last 24 hours) at 05/01/2018 0705 Last data filed at 05/01/2018 0600 Gross per 24 hour  Intake 1362.5 ml  Output 850 ml  Net 512.5 ml     Labs: Recent Labs    04/30/18 0538 05/01/18 0524  HGB 10.6* 9.2*   Recent Labs    04/30/18 0538 05/01/18 0524  WBC 12.3* 14.6*  RBC 3.69* 3.18*  HCT 33.4* 29.2*  PLT 232 216   Recent Labs    04/30/18 0538 05/01/18 0524  NA 140 142  K 4.1 4.3  CL 106 107  CO2 25 26  BUN 14 24*  CREATININE 0.80 0.81  GLUCOSE 184* 160*  CALCIUM 8.4* 8.3*    EXAM General - Patient is Alert and Oriented Extremity - Neurologically intact Intact pulses distally Dorsiflexion/Plantar flexion intact No cellulitis present Compartment soft Dressing - dressing C/D/I and no drainage Motor Function - intact, moving foot and toes well on exam.   Past Medical History:  Diagnosis Date  . Allergy   . Asthma   . Incisional hernia    LEFT FROM  CHILDHOOD UMBLICAL HERNIA REPAIR   . Pneumonia    OVER 10 YEARS AGO   . Umbilical hernia    childhood  . Varicose veins     Assessment/Plan: 2 Days Post-Op Procedure(s) (LRB): LEFT TOTAL HIP ARTHROPLASTY ANTERIOR APPROACH (Left) Active Problems:   OA (osteoarthritis) of hip  Estimated body mass index is  33.63 kg/m as calculated from the following:   Height as of this encounter:  (1.549 m).   Weight as of this encounter: 80.7 kg (178 lb). Advance diet Up with therapy  DVT Prophylaxis - Aspirin Weight Bearing As Tolerated   Plan for continued therapy this morning with DC home with HEP today.    Dimitri Ped, PA-C Orthopaedic Surgery 05/01/2018, 7:05 AM

## 2018-05-01 NOTE — Progress Notes (Signed)
Reviewed discharge instructions and medications. Patient states having no questions at this time. Will continue to monitor.

## 2018-05-01 NOTE — Discharge Instructions (Signed)
° °Dr. Frank Aluisio °Total Joint Specialist °[facility] °[address] °[place], [place] [address] °[phone] ° °ANTERIOR APPROACH TOTAL HIP REPLACEMENT POSTOPERATIVE DIRECTIONS ° ° °Hip Rehabilitation, Guidelines Following Surgery  °The results of a hip operation are greatly improved after range of motion and muscle strengthening exercises. Follow all safety measures which are given to protect your hip. If any of these exercises cause increased pain or swelling in your joint, decrease the amount until you are comfortable again. Then slowly increase the exercises. Call your caregiver if you have problems or questions.  ° °BLOOD CLOT PREVENTION: °Take one tablet (325 mg) Aspirin two times a day for three weeks following surgery. °Then take one baby Aspirin (81 mg) once a day for three weeks. °Then discontinue aspirin.  ° °HOME CARE INSTRUCTIONS  °Remove items at home which could result in a fall. This includes throw rugs or furniture in walking pathways.  °· ICE to the affected hip every three hours for 30 minutes at a time and then as needed for pain and swelling.  Continue to use ice on the hip for pain and swelling from surgery. You may notice swelling that will progress down to the foot and ankle.  This is normal after surgery.  Elevate the leg when you are not up walking on it.   °· Continue to use the breathing machine which will help keep your temperature down.  It is common for your temperature to cycle up and down following surgery, especially at night when you are not up moving around and exerting yourself.  The breathing machine keeps your lungs expanded and your temperature down. ° ° °DIET °You may resume your previous home diet once your are discharged from the hospital. ° °DRESSING / WOUND CARE / SHOWERING °You may shower 3 days after surgery, but keep the wounds dry during showering.  You may use an occlusive plastic wrap (Press'n Seal for example), NO SOAKING/SUBMERGING IN THE  BATHTUB.  If the bandage gets wet, change with a clean dry gauze.  If the incision gets wet, pat the wound dry with a clean towel. °You may start showering once you are discharged home but do not submerge the incision under water. Just pat the incision dry and apply a dry gauze dressing on daily. °Change the surgical dressing daily and reapply a dry dressing each time. ° °ACTIVITY °Walk with your walker as instructed. °Use walker as long as suggested by your caregivers. °Avoid periods of inactivity such as sitting longer than an hour when not asleep. This helps prevent blood clots.  °You may resume a sexual relationship in one month or when given the OK by your doctor.  °You may return to work once you are cleared by your doctor.  °Do not drive a car for 6 weeks or until released by you surgeon.  °Do not drive while taking narcotics. ° °WEIGHT BEARING °Weight bearing as tolerated with assist device (walker, cane, etc) as directed, use it as long as suggested by your surgeon or therapist, typically at least 4-6 weeks. ° °POSTOPERATIVE CONSTIPATION PROTOCOL °Constipation - defined medically as fewer than three stools per week and severe constipation as less than one stool per week. ° °One of the most common issues patients have following surgery is constipation.  Even if you have a regular bowel pattern at home, your normal regimen is likely to be disrupted due to multiple reasons following surgery.  Combination of anesthesia, postoperative narcotics, change in appetite and   fluid intake all can affect your bowels.  In order to avoid complications following surgery, here are some recommendations in order to help you during your recovery period. ° °Colace (docusate) - Pick up an over-the-counter form of Colace or another stool softener and take twice a day as long as you are requiring postoperative pain medications.  Take with a full glass of water daily.  If you experience loose stools or diarrhea, hold the colace  until you stool forms back up.  If your symptoms do not get better within 1 week or if they get worse, check with your doctor. ° °Dulcolax (bisacodyl) - Pick up over-the-counter and take as directed by the product packaging as needed to assist with the movement of your bowels.  Take with a full glass of water.  Use this product as needed if not relieved by Colace only.  ° °MiraLax (polyethylene glycol) - Pick up over-the-counter to have on hand.  MiraLax is a solution that will increase the amount of water in your bowels to assist with bowel movements.  Take as directed and can mix with a glass of water, juice, soda, coffee, or tea.  Take if you go more than two days without a movement. °Do not use MiraLax more than once per day. Call your doctor if you are still constipated or irregular after using this medication for 7 days in a row. ° °If you continue to have problems with postoperative constipation, please contact the office for further assistance and recommendations.  If you experience "the worst abdominal pain ever" or develop nausea or vomiting, please contact the office immediatly for further recommendations for treatment. ° °ITCHING ° If you experience itching with your medications, try taking only a single pain pill, or even half a pain pill at a time.  You can also use Benadryl over the counter for itching or also to help with sleep.  ° °TED HOSE STOCKINGS °Wear the elastic stockings on both legs for three weeks following surgery during the day but you may remove then at night for sleeping. ° °MEDICATIONS °See your medication summary on the “After Visit Summary” that the nursing staff will review with you prior to discharge.  You may have some home medications which will be placed on hold until you complete the course of blood thinner medication.  It is important for you to complete the blood thinner medication as prescribed by your surgeon.  Continue your approved medications as instructed at time of  discharge. ° °PRECAUTIONS °If you experience chest pain or shortness of breath - call 911 immediately for transfer to the hospital emergency department.  °If you develop a fever greater that 101 F, purulent drainage from wound, increased redness or drainage from wound, foul odor from the wound/dressing, or calf pain - CONTACT YOUR SURGEON.   °                                                °FOLLOW-UP APPOINTMENTS °Make sure you keep all of your appointments after your operation with your surgeon and caregivers. You should call the office at the above phone number and make an appointment for approximately two weeks after the date of your surgery or on the date instructed by your surgeon outlined in the "After Visit Summary". ° °RANGE OF MOTION AND STRENGTHENING EXERCISES  °These exercises are designed   to help you keep full movement of your hip joint. Follow your caregiver's or physical therapist's instructions. Perform all exercises about fifteen times, three times per day or as directed. Exercise both hips, even if you have had only one joint replacement. These exercises can be done on a training (exercise) mat, on the floor, on a table or on a bed. Use whatever works the best and is most comfortable for you. Use music or television while you are exercising so that the exercises are a pleasant break in your day. This will make your life better with the exercises acting as a break in routine you can look forward to.  °Lying on your back, slowly slide your foot toward your buttocks, raising your knee up off the floor. Then slowly slide your foot back down until your leg is straight again.  °Lying on your back spread your legs as far apart as you can without causing discomfort.  °Lying on your side, raise your upper leg and foot straight up from the floor as far as is comfortable. Slowly lower the leg and repeat.  °Lying on your back, tighten up the muscle in the front of your thigh (quadriceps muscles). You can do  this by keeping your leg straight and trying to raise your heel off the floor. This helps strengthen the largest muscle supporting your knee.  °Lying on your back, tighten up the muscles of your buttocks both with the legs straight and with the knee bent at a comfortable angle while keeping your heel on the floor.  ° °IF YOU ARE TRANSFERRED TO A SKILLED REHAB FACILITY °If the patient is transferred to a skilled rehab facility following release from the hospital, a list of the current medications will be sent to the facility for the patient to continue.  When discharged from the skilled rehab facility, please have the facility set up the patient's Home Health Physical Therapy prior to being released. Also, the skilled facility will be responsible for providing the patient with their medications at time of release from the facility to include their pain medication, the muscle relaxants, and their blood thinner medication. If the patient is still at the rehab facility at time of the two week follow up appointment, the skilled rehab facility will also need to assist the patient in arranging follow up appointment in our office and any transportation needs. ° °MAKE SURE YOU:  °Understand these instructions.  °Get help right away if you are not doing well or get worse.  ° ° °Pick up stool softner and laxative for home use following surgery while on pain medications. °Do not submerge incision under water. °Please use good hand washing techniques while changing dressing each day. °May shower starting three days after surgery. °Please use a clean towel to pat the incision dry following showers. °Continue to use ice for pain and swelling after surgery. °Do not use any lotions or creams on the incision until instructed by your surgeon. °

## 2018-05-05 NOTE — Discharge Summary (Signed)
Physician Discharge Summary   Patient ID: Michele Thornton MRN: [id number] DOB/AGE: May 27, 1951 67 y.o.  Admit date: 04/29/2018 Discharge date: 05/01/2018  Primary Diagnosis: Primary osteoarthritis left hip  Admission Diagnoses:  Past Medical History:  Diagnosis Date  . Allergy   . Asthma   . Incisional hernia    LEFT FROM  CHILDHOOD UMBLICAL HERNIA REPAIR   . Pneumonia    OVER 10 YEARS AGO   . Umbilical hernia    childhood  . Varicose veins    Discharge Diagnoses:   Active Problems:   OA (osteoarthritis) of hip  Estimated body mass index is 33.63 kg/m as calculated from the following:   Height as of this encounter: '5\' 1"'$  (1.549 m).   Weight as of this encounter: 80.7 kg (178 lb).  Procedure(s) (LRB): LEFT TOTAL HIP ARTHROPLASTY ANTERIOR APPROACH (Left)   Consults: None  HPI: Michele Thornton, 66 y.o. female, has a history of pain and functional disability in the left hip(s) due to arthritis and patient has failed non-surgical conservative treatments for greater than 12 weeks to include NSAID's and/or analgesics, corticosteriod injections, flexibility and strengthening excercises and activity modification.  Onset of symptoms was gradual starting 2 years ago with gradually worsening course since that time.The patient noted no past surgery on the left hip(s).  Patient currently rates pain in the left hip at 8 out of 10 with activity. Patient has night pain, worsening of pain with activity and weight bearing, pain that interfers with activities of daily living and pain with passive range of motion. Patient has evidence of subchondral cysts, periarticular osteophytes and joint space narrowing by imaging studies. This condition presents safety issues increasing the risk of falls.  There is no current active infection.   Laboratory Data: Admission on 04/29/2018, Discharged on 05/01/2018  Component Date Value Ref Range Status  . WBC 04/30/2018 12.3* 4.0 - 10.5 K/uL Final  . RBC 04/30/2018  3.69* 3.87 - 5.11 MIL/uL Final  . Hemoglobin 04/30/2018 10.6* 12.0 - 15.0 g/dL Final  . HCT 04/30/2018 33.4* 36.0 - 46.0 % Final  . MCV 04/30/2018 90.5  78.0 - 100.0 fL Final  . MCH 04/30/2018 28.7  26.0 - 34.0 pg Final  . MCHC 04/30/2018 31.7  30.0 - 36.0 g/dL Final  . RDW 04/30/2018 14.0  11.5 - 15.5 % Final  . Platelets 04/30/2018 232  150 - 400 K/uL Final   Performed at [facility], [place] [address], [place], [place] [address]  . Sodium 04/30/2018 140  135 - 145 mmol/L Final  . Potassium 04/30/2018 4.1  3.5 - 5.1 mmol/L Final  . Chloride 04/30/2018 106  101 - 111 mmol/L Final  . CO2 04/30/2018 25  22 - 32 mmol/L Final  . Glucose, Bld 04/30/2018 184* 65 - 99 mg/dL Final  . BUN 04/30/2018 14  6 - 20 mg/dL Final  . Creatinine, Ser 04/30/2018 0.80  0.44 - 1.00 mg/dL Final  . Calcium 04/30/2018 8.4* 8.9 - 10.3 mg/dL Final  . GFR calc non Af Amer 04/30/2018 >60  >60 mL/min Final  . GFR calc Af Amer 04/30/2018 >60  >60 mL/min Final   Comment: (NOTE) The eGFR has been calculated using the CKD EPI equation. This calculation has not been validated in all clinical situations. eGFR's persistently <60 mL/min signify possible Chronic Kidney Disease.   Georgiann Hahn gap 04/30/2018 9  5 - 15 Final   Performed at [facility], [place] [address] [place], [place]  [address]  . WBC 05/01/2018 14.6* 4.0 - 10.5 K/uL Final  . RBC 05/01/2018 3.18* 3.87 - 5.11 MIL/uL Final  . Hemoglobin 05/01/2018 9.2* 12.0 - 15.0 g/dL Final  . HCT 05/01/2018 29.2* 36.0 - 46.0 % Final  . MCV 05/01/2018 91.8  78.0 - 100.0 fL Final  . MCH 05/01/2018 28.9  26.0 - 34.0 pg Final  . MCHC 05/01/2018 31.5  30.0 - 36.0 g/dL Final  . RDW 05/01/2018 14.4  11.5 - 15.5 % Final  . Platelets 05/01/2018 216  150 - 400 K/uL Final   Performed at [facility], [place] [address], [place], [place] [address]  . Sodium 05/01/2018 142  135 - 145 mmol/L Final  . Potassium 05/01/2018 4.3  3.5 - 5.1  mmol/L Final  . Chloride 05/01/2018 107  101 - 111 mmol/L Final  . CO2 05/01/2018 26  22 - 32 mmol/L Final  . Glucose, Bld 05/01/2018 160* 65 - 99 mg/dL Final  . BUN 05/01/2018 24* 6 - 20 mg/dL Final  . Creatinine, Ser 05/01/2018 0.81  0.44 - 1.00 mg/dL Final  . Calcium 05/01/2018 8.3* 8.9 - 10.3 mg/dL Final  . GFR calc non Af Amer 05/01/2018 >60  >60 mL/min Final  . GFR calc Af Amer 05/01/2018 >60  >60 mL/min Final   Comment: (NOTE) The eGFR has been calculated using the CKD EPI equation. This calculation has not been validated in all clinical situations. eGFR's persistently <60 mL/min signify possible Chronic Kidney Disease.   Georgiann Hahn gap 05/01/2018 9  5 - 15 Final   Performed at [facility], [place] [address], [place], [place] [address]  Hospital Outpatient Visit on 04/22/2018  Component Date Value Ref Range Status  . MRSA, PCR 04/22/2018 NEGATIVE  NEGATIVE Final  . Staphylococcus aureus 04/22/2018 POSITIVE* NEGATIVE Final   Comment: (NOTE) The Xpert SA Assay (FDA approved for NASAL specimens in patients 34 years of age and older), is one component of a comprehensive surveillance program. It is not intended to diagnose infection nor to guide or monitor treatment. Performed at [facility] [place] [address], [place], [place] [address]   . aPTT 04/22/2018 25  24 - 36 seconds Final   Performed at [facility], [place] [address] [place], [place] [address]  . WBC 04/22/2018 6.1  4.0 - 10.5 K/uL Final  . RBC 04/22/2018 4.56  3.87 - 5.11 MIL/uL Final  . Hemoglobin 04/22/2018 13.5  12.0 - 15.0 g/dL Final  . HCT 04/22/2018 42.3  36.0 - 46.0 % Final  . MCV 04/22/2018 92.8  78.0 - 100.0 fL Final  . MCH 04/22/2018 29.6  26.0 - 34.0 pg Final  . MCHC 04/22/2018 31.9  30.0 - 36.0 g/dL Final  . RDW 04/22/2018 14.1  11.5 - 15.5 % Final  . Platelets 04/22/2018 239  150 - 400 K/uL Final   Performed at [facility], [place]  [address], [place], [place] [address]  . Sodium 04/22/2018 143  135 - 145 mmol/L Final  . Potassium 04/22/2018 4.4  3.5 - 5.1 mmol/L Final  . Chloride 04/22/2018 105  101 - 111 mmol/L Final  . CO2 04/22/2018 25  22 - 32 mmol/L Final  . Glucose, Bld 04/22/2018 84  65 - 99 mg/dL Final  . BUN 04/22/2018 16  6 - 20 mg/dL Final  . Creatinine, Ser 04/22/2018 0.71  0.44 - 1.00 mg/dL Final  . Calcium 04/22/2018 9.0  8.9 - 10.3 mg/dL  Final  . Total Protein 04/22/2018 7.1  6.5 - 8.1 g/dL Final  . Albumin 04/22/2018 4.0  3.5 - 5.0 g/dL Final  . AST 04/22/2018 24  15 - 41 U/L Final  . ALT 04/22/2018 23  14 - 54 U/L Final  . Alkaline Phosphatase 04/22/2018 112  38 - 126 U/L Final  . Total Bilirubin 04/22/2018 0.6  0.3 - 1.2 mg/dL Final  . GFR calc non Af Amer 04/22/2018 >60  >60 mL/min Final  . GFR calc Af Amer 04/22/2018 >60  >60 mL/min Final   Comment: (NOTE) The eGFR has been calculated using the CKD EPI equation. This calculation has not been validated in all clinical situations. eGFR's persistently <60 mL/min signify possible Chronic Kidney Disease.   Georgiann Hahn gap 04/22/2018 13  5 - 15 Final   Performed at [facility], [place] [address] [place], [place] [address]  . Prothrombin Time 04/22/2018 12.1  11.4 - 15.2 seconds Final  . INR 04/22/2018 0.90   Final   Performed at [place] [address], [place], [place] [address]  . ABO/RH(D) 04/22/2018 O POS   Final  . Antibody Screen 04/22/2018 NEG   Final  . Sample Expiration 04/22/2018 05/02/2018   Final  . Extend sample reason 04/22/2018    Final                   Value:NO TRANSFUSIONS OR PREGNANCY IN THE PAST 3 MONTHS Performed at [facility], [place] [address], [place], [place] [address]      X-Rays:Dg Pelvis Portable  Result Date: 04/29/2018 CLINICAL DATA:  Postop left hip replacement. EXAM: PORTABLE PELVIS 1-2 VIEWS COMPARISON:  Fluoroscopic images acquired intraoperatively on the  same day. FINDINGS: New uncemented left hip arthroplasty without postoperative complication nor hardware failure. Surgical drain is seen along the lateral aspect of the left hip arthroplasty. Subcutaneous emphysema consistent with recent postop change involving the left hip and thigh. The included right hip arthroplasty is unremarkable. The bony pelvis is intact. There is lower lumbar degenerative disc and facet arthropathy at L5-S1. IMPRESSION: No immediate postoperative complications status post left hip arthroplasty. Single drain is in place. Electronically Signed   By: Ashley Royalty M.D.   On: 04/29/2018 16:30   Dg C-arm 1-60 Min-no Report  Result Date: 04/29/2018 Fluoroscopy was utilized by the requesting physician.  No radiographic interpretation.    Hospital Course: Patient was admitted to [facility] and taken to the OR and underwent the above state procedure without complications.  Patient tolerated the procedure well and was later transferred to the recovery room and then to the orthopaedic floor for postoperative care.  They were given PO and IV analgesics for pain control following their surgery.  They were given 24 hours of postoperative antibiotics of  Anti-infectives (From admission, onward)   Start     Dose/Rate Route Frequency Ordered Stop   04/29/18 2000  ceFAZolin (ANCEF) IVPB 2g/100 mL premix     2 g 200 mL/hr over 30 Minutes Intravenous Every 6 hours 04/29/18 1647 04/30/18 0131   04/29/18 1215  ceFAZolin (ANCEF) IVPB 2g/100 mL premix     2 g 200 mL/hr over 30 Minutes Intravenous On call to O.R. 04/29/18 1206 04/29/18 1358     and started on DVT prophylaxis in the form of Aspirin.   PT and OT were ordered for total hip protocol.  The patient was allowed to be WBAT with therapy. Discharge  planning was consulted to help with postop disposition and equipment needs.  Patient had a fair night on the evening of surgery.  They started to get up OOB with therapy on day one.   Hemovac drain was pulled without difficulty.  Continued to work with therapy into day two.  Dressing was changed on day two and the incision was clean and dry.  The patient had progressed with therapy and meeting their goals.  Incision was healing well.  Patient was seen in rounds and was ready to go home.  Diet: Cardiac diet Activity:WBAT Follow-up:in 2 weeks Disposition - Home Discharged Condition: stable   Discharge Instructions    Call MD / Call 911   Complete by:  As directed    If you experience chest pain or shortness of breath, CALL 911 and be transported to the hospital emergency room.  If you develope a fever above 101 F, pus (white drainage) or increased drainage or redness at the wound, or calf pain, call your surgeon's office.   Call MD / Call 911   Complete by:  As directed    If you experience chest pain or shortness of breath, CALL 911 and be transported to the hospital emergency room.  If you develope a fever above 101 F, pus (white drainage) or increased drainage or redness at the wound, or calf pain, call your surgeon's office.   Constipation Prevention   Complete by:  As directed    Drink plenty of fluids.  Prune juice may be helpful.  You may use a stool softener, such as Colace (over the counter) 100 mg twice a day.  Use MiraLax (over the counter) for constipation as needed.   Constipation Prevention   Complete by:  As directed    Drink plenty of fluids.  Prune juice may be helpful.  You may use a stool softener, such as Colace (over the counter) 100 mg twice a day.  Use MiraLax (over the counter) for constipation as needed.   Diet - low sodium heart healthy   Complete by:  As directed    Diet - low sodium heart healthy   Complete by:  As directed    Discharge instructions   Complete by:  As directed    Dr. Gaynelle Arabian Total Joint Specialist [facility] [address], [place], [place] [address] [phone]  ANTERIOR APPROACH TOTAL HIP  REPLACEMENT POSTOPERATIVE DIRECTIONS   Hip Rehabilitation, Guidelines Following Surgery  The results of a hip operation are greatly improved after range of motion and muscle strengthening exercises. Follow all safety measures which are given to protect your hip. If any of these exercises cause increased pain or swelling in your joint, decrease the amount until you are comfortable again. Then slowly increase the exercises. Call your caregiver if you have problems or questions.   BLOOD CLOT PREVENTION:  Take one tablet (325 mg) Aspirin two times a day for three weeks following surgery. Then take one baby Aspirin (81 mg) once a day for three weeks. Then discontinue aspirin.   HOME CARE INSTRUCTIONS:  Remove items at home which could result in a fall. This includes throw rugs or furniture in walking pathways.  ICE to the affected hip every three hours for 30 minutes at a time and then as needed for pain and swelling.  Continue to use ice on the hip for pain and swelling from surgery. You may notice swelling that will progress down to the foot and ankle.  This  is normal after surgery.  Elevate the leg when you are not up walking on it.   Continue to use the breathing machine which will help keep your temperature down.  It is common for your temperature to cycle up and down following surgery, especially at night when you are not up moving around and exerting yourself.  The breathing machine keeps your lungs expanded and your temperature down.   DIET You may resume your previous home diet once your are discharged from the hospital.  DRESSING / WOUND CARE / SHOWERING You may shower 3 days after surgery, but keep the wounds dry during showering.  You may use an occlusive plastic wrap (Press'n Seal for example), NO SOAKING/SUBMERGING IN THE BATHTUB.  If the bandage gets wet, change with a clean dry gauze.  If the incision gets wet, pat the wound dry with a clean towel. You may start showering once you  are discharged home but do not submerge the incision under water. Just pat the incision dry and apply a dry gauze dressing on daily. Change the surgical dressing daily and reapply a dry dressing each time.  ACTIVITY Walk with your walker as instructed. Use walker as long as suggested by your caregivers. Avoid periods of inactivity such as sitting longer than an hour when not asleep. This helps prevent blood clots.  You may resume a sexual relationship in one month or when given the OK by your doctor.  You may return to work once you are cleared by your doctor.  Do not drive a car for 6 weeks or until released by you surgeon.  Do not drive while taking narcotics.  WEIGHT BEARING Weight bearing as tolerated with assist device (walker, cane, etc) as directed, use it as long as suggested by your surgeon or therapist, typically at least 4-6 weeks.  POSTOPERATIVE CONSTIPATION PROTOCOL Constipation - defined medically as fewer than three stools per week and severe constipation as less than one stool per week.  One of the most common issues patients have following surgery is constipation.  Even if you have a regular bowel pattern at home, your normal regimen is likely to be disrupted due to multiple reasons following surgery.  Combination of anesthesia, postoperative narcotics, change in appetite and fluid intake all can affect your bowels.  In order to avoid complications following surgery, here are some recommendations in order to help you during your recovery period.  Colace (docusate) - Pick up an over-the-counter form of Colace or another stool softener and take twice a day as long as you are requiring postoperative pain medications.  Take with a full glass of water daily.  If you experience loose stools or diarrhea, hold the colace until you stool forms back up.  If your symptoms do not get better within 1 week or if they get worse, check with your doctor.  Dulcolax (bisacodyl) - Pick up  over-the-counter and take as directed by the product packaging as needed to assist with the movement of your bowels.  Take with a full glass of water.  Use this product as needed if not relieved by Colace only.   MiraLax (polyethylene glycol) - Pick up over-the-counter to have on hand.  MiraLax is a solution that will increase the amount of water in your bowels to assist with bowel movements.  Take as directed and can mix with a glass of water, juice, soda, coffee, or tea.  Take if you go more than two days without a movement. Do not  use MiraLax more than once per day. Call your doctor if you are still constipated or irregular after using this medication for 7 days in a row.  If you continue to have problems with postoperative constipation, please contact the office for further assistance and recommendations.  If you experience "the worst abdominal pain ever" or develop nausea or vomiting, please contact the office immediatly for further recommendations for treatment.  ITCHING  If you experience itching with your medications, try taking only a single pain pill, or even half a pain pill at a time.  You can also use Benadryl over the counter for itching or also to help with sleep.   TED HOSE STOCKINGS Wear the elastic stockings on both legs for three weeks following surgery during the day but you may remove then at night for sleeping.  MEDICATIONS See your medication summary on the "After Visit Summary" that the nursing staff will review with you prior to discharge.  You may have some home medications which will be placed on hold until you complete the course of blood thinner medication.  It is important for you to complete the blood thinner medication as prescribed by your surgeon.  Continue your approved medications as instructed at time of discharge.  PRECAUTIONS If you experience chest pain or shortness of breath - call 911 immediately for transfer to the hospital emergency department.  If you  develop a fever greater that 101 F, purulent drainage from wound, increased redness or drainage from wound, foul odor from the wound/dressing, or calf pain - CONTACT YOUR SURGEON.                                                   FOLLOW-UP APPOINTMENTS Make sure you keep all of your appointments after your operation with your surgeon and caregivers. You should call the office at the above phone number and make an appointment for approximately two weeks after the date of your surgery or on the date instructed by your surgeon outlined in the "After Visit Summary".  RANGE OF MOTION AND STRENGTHENING EXERCISES  These exercises are designed to help you keep full movement of your hip joint. Follow your caregiver's or physical therapist's instructions. Perform all exercises about fifteen times, three times per day or as directed. Exercise both hips, even if you have had only one joint replacement. These exercises can be done on a training (exercise) mat, on the floor, on a table or on a bed. Use whatever works the best and is most comfortable for you. Use music or television while you are exercising so that the exercises are a pleasant break in your day. This will make your life better with the exercises acting as a break in routine you can look forward to.  Lying on your back, slowly slide your foot toward your buttocks, raising your knee up off the floor. Then slowly slide your foot back down until your leg is straight again.  Lying on your back spread your legs as far apart as you can without causing discomfort.  Lying on your side, raise your upper leg and foot straight up from the floor as far as is comfortable. Slowly lower the leg and repeat.  Lying on your back, tighten up the muscle in the front of your thigh (quadriceps muscles). You can do this by keeping your leg  straight and trying to raise your heel off the floor. This helps strengthen the largest muscle supporting your knee.  Lying on your back,  tighten up the muscles of your buttocks both with the legs straight and with the knee bent at a comfortable angle while keeping your heel on the floor.   IF YOU ARE TRANSFERRED TO A SKILLED REHAB FACILITY If the patient is transferred to a skilled rehab facility following release from the hospital, a list of the current medications will be sent to the facility for the patient to continue.  When discharged from the skilled rehab facility, please have the facility set up the patient's [place] prior to being released. Also, the skilled facility will be responsible for providing the patient with their medications at time of release from the facility to include their pain medication, the muscle relaxants, and their blood thinner medication. If the patient is still at the rehab facility at time of the two week follow up appointment, the skilled rehab facility will also need to assist the patient in arranging follow up appointment in our office and any transportation needs.  MAKE SURE YOU:  Understand these instructions.  Get help right away if you are not doing well or get worse.    Pick up stool softner and laxative for home use following surgery while on pain medications. Do not submerge incision under water. Please use good hand washing techniques while changing dressing each day. May shower starting three days after surgery. Please use a clean towel to pat the incision dry following showers. Continue to use ice for pain and swelling after surgery. Do not use any lotions or creams on the incision until instructed by your surgeon.   Discharge instructions   Complete by:  As directed    Dr. Gaynelle Arabian Total Joint Specialist [facility] [address] [place], [place] [address] [phone]  ANTERIOR APPROACH TOTAL HIP REPLACEMENT POSTOPERATIVE DIRECTIONS   Hip Rehabilitation, Guidelines Following Surgery  The results of a hip operation are greatly  improved after range of motion and muscle strengthening exercises. Follow all safety measures which are given to protect your hip. If any of these exercises cause increased pain or swelling in your joint, decrease the amount until you are comfortable again. Then slowly increase the exercises. Call your caregiver if you have problems or questions.   BLOOD CLOT PREVENTION:  Take one tablet (325 mg) Aspirin two times a day for three weeks following surgery. Then take one baby Aspirin (81 mg) once a day for three weeks. Then discontinue aspirin.   HOME CARE INSTRUCTIONS:  Remove items at home which could result in a fall. This includes throw rugs or furniture in walking pathways.  ICE to the affected hip every three hours for 30 minutes at a time and then as needed for pain and swelling.  Continue to use ice on the hip for pain and swelling from surgery. You may notice swelling that will progress down to the foot and ankle.  This is normal after surgery.  Elevate the leg when you are not up walking on it.   Continue to use the breathing machine which will help keep your temperature down.  It is common for your temperature to cycle up and down following surgery, especially at night when you are not up moving around and exerting yourself.  The breathing machine keeps your lungs expanded and your temperature down.   DIET You may resume your previous  home diet once your are discharged from the hospital.  DRESSING / WOUND CARE / SHOWERING You may shower 3 days after surgery, but keep the wounds dry during showering.  You may use an occlusive plastic wrap (Press'n Seal for example), NO SOAKING/SUBMERGING IN THE BATHTUB.  If the bandage gets wet, change with a clean dry gauze.  If the incision gets wet, pat the wound dry with a clean towel. You may start showering once you are discharged home but do not submerge the incision under water. Just pat the incision dry and apply a dry gauze dressing on  daily. Change the surgical dressing daily and reapply a dry dressing each time.  ACTIVITY Walk with your walker as instructed. Use walker as long as suggested by your caregivers. Avoid periods of inactivity such as sitting longer than an hour when not asleep. This helps prevent blood clots.  You may resume a sexual relationship in one month or when given the OK by your doctor.  You may return to work once you are cleared by your doctor.  Do not drive a car for 6 weeks or until released by you surgeon.  Do not drive while taking narcotics.  WEIGHT BEARING Weight bearing as tolerated with assist device (walker, cane, etc) as directed, use it as long as suggested by your surgeon or therapist, typically at least 4-6 weeks.  POSTOPERATIVE CONSTIPATION PROTOCOL Constipation - defined medically as fewer than three stools per week and severe constipation as less than one stool per week.  One of the most common issues patients have following surgery is constipation.  Even if you have a regular bowel pattern at home, your normal regimen is likely to be disrupted due to multiple reasons following surgery.  Combination of anesthesia, postoperative narcotics, change in appetite and fluid intake all can affect your bowels.  In order to avoid complications following surgery, here are some recommendations in order to help you during your recovery period.  Colace (docusate) - Pick up an over-the-counter form of Colace or another stool softener and take twice a day as long as you are requiring postoperative pain medications.  Take with a full glass of water daily.  If you experience loose stools or diarrhea, hold the colace until you stool forms back up.  If your symptoms do not get better within 1 week or if they get worse, check with your doctor.  Dulcolax (bisacodyl) - Pick up over-the-counter and take as directed by the product packaging as needed to assist with the movement of your bowels.  Take with a full  glass of water.  Use this product as needed if not relieved by Colace only.   MiraLax (polyethylene glycol) - Pick up over-the-counter to have on hand.  MiraLax is a solution that will increase the amount of water in your bowels to assist with bowel movements.  Take as directed and can mix with a glass of water, juice, soda, coffee, or tea.  Take if you go more than two days without a movement. Do not use MiraLax more than once per day. Call your doctor if you are still constipated or irregular after using this medication for 7 days in a row.  If you continue to have problems with postoperative constipation, please contact the office for further assistance and recommendations.  If you experience "the worst abdominal pain ever" or develop nausea or vomiting, please contact the office immediatly for further recommendations for treatment.  ITCHING  If you experience itching with  your medications, try taking only a single pain pill, or even half a pain pill at a time.  You can also use Benadryl over the counter for itching or also to help with sleep.   TED HOSE STOCKINGS Wear the elastic stockings on both legs for three weeks following surgery during the day but you may remove then at night for sleeping.  MEDICATIONS See your medication summary on the "After Visit Summary" that the nursing staff will review with you prior to discharge.  You may have some home medications which will be placed on hold until you complete the course of blood thinner medication.  It is important for you to complete the blood thinner medication as prescribed by your surgeon.  Continue your approved medications as instructed at time of discharge.  PRECAUTIONS If you experience chest pain or shortness of breath - call 911 immediately for transfer to the hospital emergency department.  If you develop a fever greater that 101 F, purulent drainage from wound, increased redness or drainage from wound, foul odor from the  wound/dressing, or calf pain - CONTACT YOUR SURGEON.                                                   FOLLOW-UP APPOINTMENTS Make sure you keep all of your appointments after your operation with your surgeon and caregivers. You should call the office at the above phone number and make an appointment for approximately two weeks after the date of your surgery or on the date instructed by your surgeon outlined in the "After Visit Summary".  RANGE OF MOTION AND STRENGTHENING EXERCISES  These exercises are designed to help you keep full movement of your hip joint. Follow your caregiver's or physical therapist's instructions. Perform all exercises about fifteen times, three times per day or as directed. Exercise both hips, even if you have had only one joint replacement. These exercises can be done on a training (exercise) mat, on the floor, on a table or on a bed. Use whatever works the best and is most comfortable for you. Use music or television while you are exercising so that the exercises are a pleasant break in your day. This will make your life better with the exercises acting as a break in routine you can look forward to.  Lying on your back, slowly slide your foot toward your buttocks, raising your knee up off the floor. Then slowly slide your foot back down until your leg is straight again.  Lying on your back spread your legs as far apart as you can without causing discomfort.  Lying on your side, raise your upper leg and foot straight up from the floor as far as is comfortable. Slowly lower the leg and repeat.  Lying on your back, tighten up the muscle in the front of your thigh (quadriceps muscles). You can do this by keeping your leg straight and trying to raise your heel off the floor. This helps strengthen the largest muscle supporting your knee.  Lying on your back, tighten up the muscles of your buttocks both with the legs straight and with the knee bent at a comfortable angle while keeping  your heel on the floor.   IF YOU ARE TRANSFERRED TO A SKILLED REHAB FACILITY If the patient is transferred to a skilled rehab facility following release from the  hospital, a list of the current medications will be sent to the facility for the patient to continue.  When discharged from the skilled rehab facility, please have the facility set up the patient's [place] prior to being released. Also, the skilled facility will be responsible for providing the patient with their medications at time of release from the facility to include their pain medication, the muscle relaxants, and their blood thinner medication. If the patient is still at the rehab facility at time of the two week follow up appointment, the skilled rehab facility will also need to assist the patient in arranging follow up appointment in our office and any transportation needs.  MAKE SURE YOU:  Understand these instructions.  Get help right away if you are not doing well or get worse.    Pick up stool softner and laxative for home use following surgery while on pain medications. Do not submerge incision under water. Please use good hand washing techniques while changing dressing each day. May shower starting three days after surgery. Please use a clean towel to pat the incision dry following showers. Continue to use ice for pain and swelling after surgery. Do not use any lotions or creams on the incision until instructed by your surgeon.   Increase activity slowly as tolerated   Complete by:  As directed    Increase activity slowly as tolerated   Complete by:  As directed      Allergies as of 05/01/2018      Reactions   Adhesive [tape]    CAUSES SKIN REDNESS    Sulfa Antibiotics Other (See Comments)   Unknown--cannot recall reaction      Medication List    STOP taking these medications   celecoxib 200 MG capsule Commonly known as:  CELEBREX     TAKE these medications   acetaminophen 500 MG  tablet Commonly known as:  TYLENOL Take 1,000 mg by mouth 3 (three) times daily as needed (for pain).   ALLERGY RELIEF 25 MG tablet Generic drug:  diphenhydrAMINE Take 25 mg by mouth 2 (two) times daily as needed (for allergies.).   aspirin 325 MG EC tablet Take 1 tablet (325 mg total) by mouth 2 (two) times daily. Take twice a day for three weeks to prevent blood clots   budesonide-formoterol 160-4.5 MCG/ACT inhaler Commonly known as:  SYMBICORT Inhale 2 puffs into the lungs 2 (two) times daily.   loratadine 10 MG tablet Commonly known as:  CLARITIN Take 10 mg by mouth daily as needed (FOR RUNNY NOSE/ALLERGIES.).   methocarbamol 500 MG tablet Commonly known as:  ROBAXIN Take 1 tablet (500 mg total) by mouth every 6 (six) hours as needed for muscle spasms.   oxyCODONE 5 MG immediate release tablet Commonly known as:  Oxy IR/ROXICODONE Take 1-2 tablets (5-10 mg total) by mouth every 6 (six) hours as needed for severe pain (pain score 7-10). What changed:    when to take this  reasons to take this   Bricelyn 0.5-1-0.5 % Soln Generic drug:  Carboxymeth-Glycerin-Polysorb Place 1 drop into both eyes at bedtime.   traMADol 50 MG tablet Commonly known as:  ULTRAM Take 1-2 tablets (50-100 mg total) by mouth every 6 (six) hours as needed for moderate pain (not responsive to oxycodone). What changed:  reasons to take this   VENTOLIN HFA 108 (90 Base) MCG/ACT inhaler Generic drug:  albuterol Inhale 2 puffs into the lungs every 4 (four) hours as needed for  wheezing or shortness of breath.   VISINE-A 0.025-0.3 % ophthalmic solution Generic drug:  naphazoline-pheniramine Place 1 drop into both eyes 4 (four) times daily as needed (for irritation/allergy eyes.).      Follow-up Information    Gaynelle Arabian, MD Follow up in 2 week(s).   Specialty:  Orthopedic Surgery Contact information: [address] [place] [address] [phone]            Signed: Ardeen Jourdain, PA-C Orthopaedic Surgery 05/05/2018, 11:39 AM

## 2018-06-02 DIAGNOSIS — Z471 Aftercare following joint replacement surgery: Secondary | ICD-10-CM | POA: Diagnosis not present

## 2018-06-02 DIAGNOSIS — Z96642 Presence of left artificial hip joint: Secondary | ICD-10-CM | POA: Diagnosis not present

## 2018-07-09 DIAGNOSIS — H524 Presbyopia: Secondary | ICD-10-CM | POA: Diagnosis not present

## 2018-07-09 DIAGNOSIS — H04123 Dry eye syndrome of bilateral lacrimal glands: Secondary | ICD-10-CM | POA: Diagnosis not present

## 2018-07-09 DIAGNOSIS — H2513 Age-related nuclear cataract, bilateral: Secondary | ICD-10-CM | POA: Diagnosis not present

## 2018-07-14 DIAGNOSIS — M159 Polyosteoarthritis, unspecified: Secondary | ICD-10-CM | POA: Diagnosis not present

## 2018-07-22 DIAGNOSIS — M6281 Muscle weakness (generalized): Secondary | ICD-10-CM | POA: Diagnosis not present

## 2018-07-22 DIAGNOSIS — Z96643 Presence of artificial hip joint, bilateral: Secondary | ICD-10-CM | POA: Diagnosis not present

## 2018-07-22 DIAGNOSIS — M62551 Muscle wasting and atrophy, not elsewhere classified, right thigh: Secondary | ICD-10-CM | POA: Diagnosis not present

## 2018-07-22 DIAGNOSIS — M62552 Muscle wasting and atrophy, not elsewhere classified, left thigh: Secondary | ICD-10-CM | POA: Diagnosis not present

## 2018-07-22 DIAGNOSIS — R2689 Other abnormalities of gait and mobility: Secondary | ICD-10-CM | POA: Diagnosis not present

## 2018-08-03 DIAGNOSIS — M62551 Muscle wasting and atrophy, not elsewhere classified, right thigh: Secondary | ICD-10-CM | POA: Diagnosis not present

## 2018-08-03 DIAGNOSIS — R2689 Other abnormalities of gait and mobility: Secondary | ICD-10-CM | POA: Diagnosis not present

## 2018-08-03 DIAGNOSIS — Z96643 Presence of artificial hip joint, bilateral: Secondary | ICD-10-CM | POA: Diagnosis not present

## 2018-08-03 DIAGNOSIS — M6281 Muscle weakness (generalized): Secondary | ICD-10-CM | POA: Diagnosis not present

## 2018-08-03 DIAGNOSIS — M62552 Muscle wasting and atrophy, not elsewhere classified, left thigh: Secondary | ICD-10-CM | POA: Diagnosis not present

## 2018-08-05 DIAGNOSIS — M62552 Muscle wasting and atrophy, not elsewhere classified, left thigh: Secondary | ICD-10-CM | POA: Diagnosis not present

## 2018-08-05 DIAGNOSIS — M6281 Muscle weakness (generalized): Secondary | ICD-10-CM | POA: Diagnosis not present

## 2018-08-05 DIAGNOSIS — M62551 Muscle wasting and atrophy, not elsewhere classified, right thigh: Secondary | ICD-10-CM | POA: Diagnosis not present

## 2018-08-05 DIAGNOSIS — Z96643 Presence of artificial hip joint, bilateral: Secondary | ICD-10-CM | POA: Diagnosis not present

## 2018-08-05 DIAGNOSIS — R2689 Other abnormalities of gait and mobility: Secondary | ICD-10-CM | POA: Diagnosis not present

## 2018-08-06 ENCOUNTER — Encounter: Payer: Self-pay | Admitting: Podiatry

## 2018-08-06 ENCOUNTER — Ambulatory Visit: Payer: PPO | Admitting: Podiatry

## 2018-08-06 ENCOUNTER — Ambulatory Visit (INDEPENDENT_AMBULATORY_CARE_PROVIDER_SITE_OTHER): Payer: PPO

## 2018-08-06 DIAGNOSIS — M21611 Bunion of right foot: Secondary | ICD-10-CM

## 2018-08-06 DIAGNOSIS — M21612 Bunion of left foot: Secondary | ICD-10-CM

## 2018-08-06 DIAGNOSIS — L600 Ingrowing nail: Secondary | ICD-10-CM | POA: Diagnosis not present

## 2018-08-06 NOTE — Progress Notes (Signed)
Subjective:   Patient ID: Michele Thornton, female   DOB: 67 y.o.   MRN: [id number]   HPI Patient presents with ingrown toenails of both big toes with the right being worse than the left and states it sore when she tries to wear shoe gear.  Also complains about bunion deformities which seem to be getting gradually worse and making shoe gear difficult and she is been trying wider shoes and other modalities and overall she does pretty well but still concerned.  Patient does not smoke and likes to be active   Review of Systems  All other systems reviewed and are negative.       Objective:  Physical Exam  Constitutional: She appears well-developed and well-nourished.  Cardiovascular: Intact distal pulses.  Pulmonary/Chest: Effort normal.  Musculoskeletal: Normal range of motion.  Neurological: She is alert.  Skin: Skin is warm.  Nursing note and vitals reviewed.   Neurovascular status intact muscle strength is adequate range of motion within normal limits with patient noted to have significant incurvation of the medial lateral borders of the right hallux mild to moderate of the left with patient found to have structural bunion deformity bilateral with redness around the first metatarsal heads and moderate discomfort when palpated.  Patient is noted to have good digital perfusion and is well oriented x3     Assessment:  Ingrown toenail deformity right over left medial lateral borders and structural bunion deformity right over left      Plan:  H&P and x-rays reviewed with patient.  I explained correction of the ingrown toenail allowed patient to read consent form signed needed understanding risk.  I went ahead and infiltrated the right hallux 60 mg like Marcaine mixture sterile prep applied and using sterile instrumentation I remove the medial lateral borders of the right hallux exposed matrix and applied phenol 3 applications 30 seconds followed by alcohol lavage and sterile dressing.  I  instructed on soaks and reappoint and encouraged her to call with any questions concerns.  Bunions patient will discuss with Dr. Samuella CotaPrice and I did suggest at one point future but will need to be taken care of  X-rays indicate that there is structural bunion deformity right over left with elevation of the intermetatarsal angle of the significant nature

## 2018-08-06 NOTE — Patient Instructions (Signed)

## 2018-08-17 ENCOUNTER — Ambulatory Visit (INDEPENDENT_AMBULATORY_CARE_PROVIDER_SITE_OTHER): Payer: PPO | Admitting: Podiatry

## 2018-08-17 DIAGNOSIS — Z9889 Other specified postprocedural states: Secondary | ICD-10-CM

## 2018-08-17 NOTE — Progress Notes (Signed)
  Subjective:  Patient ID: Michele Thornton, female    DOB: [birth year],  MRN: [id number]  Chief Complaint  Patient presents with  . nail check    F/U R halllux nail check Pt. stated," I thin it's doing fine, just tender to the touch." Tx: epsom salt soaking and neosporin -w/ bloody draiange, redness, and swelling   67 y.o. female returns for the above complaint. Doing well denies pain.  Objective:   General AA&O x3. Normal mood and affect.  Vascular Foot warm and well perfused with good capillary refill.  Neurologic Sensation grossly intact.  Dermatologic Nail avulsion site healing well without drainage or erythema. Nail bed with overlying soft crust. Left intact. No signs of local infection.  Orthopedic: No tenderness to palpation of the toe.   Assessment & Plan:  Patient was evaluated and treated and all questions answered.  S/p Ingrown Toenail Excision, right -Healing well without issue. -Discussed return precautions. -F/u PRN

## 2018-09-09 DIAGNOSIS — H25813 Combined forms of age-related cataract, bilateral: Secondary | ICD-10-CM | POA: Diagnosis not present

## 2018-09-09 DIAGNOSIS — H16143 Punctate keratitis, bilateral: Secondary | ICD-10-CM | POA: Diagnosis not present

## 2018-09-09 DIAGNOSIS — H04123 Dry eye syndrome of bilateral lacrimal glands: Secondary | ICD-10-CM | POA: Diagnosis not present

## 2018-11-09 DIAGNOSIS — J4521 Mild intermittent asthma with (acute) exacerbation: Secondary | ICD-10-CM | POA: Diagnosis not present

## 2018-11-24 DIAGNOSIS — L304 Erythema intertrigo: Secondary | ICD-10-CM | POA: Diagnosis not present

## 2020-08-21 ENCOUNTER — Other Ambulatory Visit: Payer: Self-pay

## 2020-08-21 ENCOUNTER — Ambulatory Visit: Payer: PPO | Admitting: Pulmonary Disease

## 2020-08-21 ENCOUNTER — Other Ambulatory Visit (INDEPENDENT_AMBULATORY_CARE_PROVIDER_SITE_OTHER): Payer: Medicare Other

## 2020-08-21 ENCOUNTER — Encounter: Payer: Self-pay | Admitting: Pulmonary Disease

## 2020-08-21 VITALS — BP 120/70 | HR 75 | Temp 97.1°F | Ht 61.0 in | Wt 184.4 lb

## 2020-08-21 DIAGNOSIS — J4540 Moderate persistent asthma, uncomplicated: Secondary | ICD-10-CM | POA: Diagnosis not present

## 2020-08-21 DIAGNOSIS — R911 Solitary pulmonary nodule: Secondary | ICD-10-CM | POA: Diagnosis not present

## 2020-08-21 LAB — CBC WITH DIFFERENTIAL/PLATELET
Basophils Absolute: 0.2 10*3/uL — ABNORMAL HIGH (ref 0.0–0.1)
Basophils Relative: 3 % (ref 0.0–3.0)
Eosinophils Absolute: 0.4 10*3/uL (ref 0.0–0.7)
Eosinophils Relative: 6.4 % — ABNORMAL HIGH (ref 0.0–5.0)
HCT: 38.5 % (ref 36.0–46.0)
Hemoglobin: 12.5 g/dL (ref 12.0–15.0)
Lymphocytes Relative: 25.5 % (ref 12.0–46.0)
Lymphs Abs: 1.5 10*3/uL (ref 0.7–4.0)
MCHC: 32.5 g/dL (ref 30.0–36.0)
MCV: 88.5 fl (ref 78.0–100.0)
Monocytes Absolute: 0.5 10*3/uL (ref 0.1–1.0)
Monocytes Relative: 8.8 % (ref 3.0–12.0)
Neutro Abs: 3.3 10*3/uL (ref 1.4–7.7)
Neutrophils Relative %: 56.3 % (ref 43.0–77.0)
Platelets: 250 10*3/uL (ref 150.0–400.0)
RBC: 4.35 Mil/uL (ref 3.87–5.11)
RDW: 14.7 % (ref 11.5–15.5)
WBC: 5.9 10*3/uL (ref 4.0–10.5)

## 2020-08-21 NOTE — Progress Notes (Signed)
Michele Thornton    [id number]    Mar 20, 1951  Primary Care Physician:Dough, Doris Cheadle, MD  Referring Physician: Olive Bass, MD [address] [place],  [place] [address]  Chief complaint:   Consult for abnormal chest x-ray, asthma  HPI:  69 year old with history of asthma, allergies, postnasal drip Had a chest x-ray at primary care which showed granulomas, calcified lymph node and sent here for further evaluation  Has history of asthma which was diagnosed over 20 years ago.  Maintained on Symbicort for several years Also has a rescue inhaler which she does not use regularly Complains of mild dyspnea on exertion, nasal congestion with postnasal drip, nonproductive cough. No GERD symptoms  Patient had a bronchoscopy done at [facility] in the 1980s for evaluation of "spot on her lung".  Per patient and husband brushings were attempted but procedure aborted as she developed bleeding.  Results were nondiagnostic.   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  No down pillows or comforters Smoking history: 40-pack-year smoker.  Quit in 2008 Travel history: Grew up in [place].  Moved to [place] in 1992 Relevant family history: No significant family history of lung disease   Outpatient Encounter Medications as of 08/21/2020  Medication Sig  . acetaminophen (TYLENOL) 500 MG tablet Take 1,000 mg by mouth 3 (three) times daily as needed (for pain).  Marland Kitchen albuterol (VENTOLIN HFA) 108 (90 BASE) MCG/ACT inhaler Inhale 2 puffs into the lungs every 4 (four) hours as needed for wheezing or shortness of breath.  Marland Kitchen aspirin EC 325 MG EC tablet Take 1 tablet (325 mg total) by mouth 2 (two) times daily. Take twice a day for three weeks to prevent blood clots  . budesonide-formoterol (SYMBICORT) 160-4.5 MCG/ACT inhaler Inhale 2 puffs into the lungs 2 (two) times daily.  . Carboxymeth-Glycerin-Polysorb (REFRESH  OPTIVE ADVANCED) 0.5-1-0.5 % SOLN Place 1 drop into both eyes at bedtime.  . diphenhydrAMINE (ALLERGY RELIEF) 25 MG tablet Take 25 mg by mouth 2 (two) times daily as needed (for allergies.).   Marland Kitchen loratadine (CLARITIN) 10 MG tablet Take 10 mg by mouth daily as needed (FOR RUNNY NOSE/ALLERGIES.).  Marland Kitchen methocarbamol (ROBAXIN) 500 MG tablet Take 1 tablet (500 mg total) by mouth every 6 (six) hours as needed for muscle spasms.  . naphazoline-pheniramine (VISINE-A) 0.025-0.3 % ophthalmic solution Place 1 drop into both eyes 4 (four) times daily as needed (for irritation/allergy eyes.).  Marland Kitchen traMADol (ULTRAM) 50 MG tablet Take 1-2 tablets (50-100 mg total) by mouth every 6 (six) hours as needed for moderate pain (not responsive to oxycodone).  . [DISCONTINUED] oxyCODONE (OXY IR/ROXICODONE) 5 MG immediate release tablet Take 1-2 tablets (5-10 mg total) by mouth every 6 (six) hours as needed for severe pain (pain score 7-10).   No facility-administered encounter medications on file as of 08/21/2020.    Allergies as of 08/21/2020 - Review Complete 08/21/2020  Allergen Reaction Noted  . Adhesive [tape]  04/22/2018  . Sulfa antibiotics Other (See Comments) 04/03/2016    Past Medical History:  Diagnosis Date  . Allergy   . Asthma   . Incisional hernia    LEFT FROM  CHILDHOOD UMBLICAL HERNIA REPAIR   . Pneumonia    OVER 10 YEARS AGO   . Umbilical hernia    childhood  . Varicose veins     Past Surgical History:  Procedure Laterality Date  .  arm surgery Right    repair fracture   . 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 TOTAL HIP ARTHROPLASTY Left 04/29/2018   Procedure: LEFT TOTAL HIP ARTHROPLASTY ANTERIOR APPROACH;  Surgeon: Ollen Gross, MD;  Location: [facility];  Service: Orthopedics;  Laterality: Left;    Family History    Problem Relation Age of Onset  . Scoliosis Mother   . Arthritis Mother   . Stroke Mother     Social History   Socioeconomic History  . Marital status: Married    Spouse name: Not on file  . Number of children: Not on file  . Years of education: Not on file  . Highest education level: Not on file  Occupational History  . Not on file  Tobacco Use  . Smoking status: Former Smoker    Types: Cigarettes    Quit date: 07/02/2007    Years since quitting: 13.1  . Smokeless tobacco: Never Used  Substance and Sexual Activity  . Alcohol use: Yes    Comment: monthly  . Drug use: No  . Sexual activity: Not on file  Other Topics Concern  . Not on file  Social History Narrative  . Not on file   Social Determinants of Health   Financial Resource Strain:   . Difficulty of Paying Living Expenses: Not on file  Food Insecurity:   . Worried About Programme researcher, broadcasting/film/video in the Last Year: Not on file  . Ran Out of Food in the Last Year: Not on file  Transportation Needs:   . Lack of Transportation (Medical): Not on file  . Lack of Transportation (Non-Medical): Not on file  Physical Activity:   . Days of Exercise per Week: Not on file  . Minutes of Exercise per Session: Not on file  Stress:   . Feeling of Stress : Not on file  Social Connections:   . Frequency of Communication with Friends and Family: Not on file  . Frequency of Social Gatherings with Friends and Family: Not on file  . Attends Religious Services: Not on file  . Active Member of Clubs or Organizations: Not on file  . Attends Banker Meetings: Not on file  . Marital Status: Not on file  Intimate Partner Violence:   . Fear of Current or Ex-Partner: Not on file  . Emotionally Abused: Not on file  . Physically Abused: Not on file  . Sexually Abused: Not on file    Review of systems: Review of Systems  Constitutional: Negative for fever and chills.  HENT: Negative.   Eyes: Negative for blurred vision.   Respiratory: as per HPI  Cardiovascular: Negative for chest pain and palpitations.  Gastrointestinal: Negative for vomiting, diarrhea, blood per rectum. Genitourinary: Negative for dysuria, urgency, frequency and hematuria.  Musculoskeletal: Negative for myalgias, back pain and joint pain.  Skin: Negative for itching and rash.  Neurological: Negative for dizziness, tremors, focal weakness, seizures and loss of consciousness.  Endo/Heme/Allergies: Negative for environmental allergies.  Psychiatric/Behavioral: Negative for depression, suicidal ideas and hallucinations.  All other systems reviewed and are negative.  Physical Exam: Blood pressure 120/70, pulse 75, temperature (!) 97.1 F (36.2 C), temperature source Oral, height 5\' 1"  (1.549 m), weight 184 lb 7.2 oz (83.7 kg), SpO2 96 %. Gen:      No acute distress HEENT:  EOMI, sclera anicteric Neck:     No masses; no thyromegaly Lungs:    Clear to auscultation bilaterally; normal respiratory effort CV:         Regular rate and rhythm; no murmurs Abd:      + bowel sounds; soft, non-tender; no palpable masses, no distension Ext:    No edema; adequate peripheral perfusion Skin:      Warm and dry; no rash Neuro: alert and oriented x 3 Psych: normal mood and affect  Data Reviewed: Imaging: Chest x-ray [facility] 05/25/20 Calcified granuloma right upper lobe. Lungs otherwise clear. Heart  size normal. Nonenlarged calcified mediastinal lymph nodes indicates  prior granulomatous disease. The eggshell type appearance of several  of these lymph nodes raises question of prior sarcoidosis, although  residua of tuberculosis occasionally can present in this manner. No  adenopathy evident.   PFTs:  Labs:  Assessment:  Lung nodule, abnormal chest x-ray CT scan report reviewed.  No images for direct visualization Findings suggestive of prior infection or sarcoidosis with calcified granulomas and calcification of mediastinal lymph  nodes.  Appearance looks benign however given smoking history we will get a CT chest without contrast for further evaluation  Asthma Continue Symbicort Get PFTs, CBC differential and respiratory allergy profile for further evaluation  Chronic cough Upper airway cough syndrome with postnasal drip. Use over-the-counter antihistamines and steroid nasal spray as needed  Plan/Recommendations: - CT chest without contrast - PFTs, CBC, respiratory profile   Chilton Greathouse MD [facility] 08/21/2020, 10:25 AM  CC: Olive Bass, MD

## 2020-08-21 NOTE — Patient Instructions (Signed)
We will check labs today including CBC with differential, respiratory allergy profile Schedule pulmonary function test Schedule CT chest without contrast for evaluation of lung nodule Continue Symbicort  Follow-up in 1 to 2 months

## 2020-09-18 ENCOUNTER — Other Ambulatory Visit: Payer: Medicare Other

## 2020-09-18 ENCOUNTER — Ambulatory Visit
Admission: RE | Admit: 2020-09-18 | Discharge: 2020-09-18 | Disposition: A | Discharge status: Home / Self Care | Payer: Medicare Other | Source: Ambulatory Visit | Attending: Pulmonary Disease | Admitting: Pulmonary Disease

## 2020-09-18 DIAGNOSIS — R911 Solitary pulmonary nodule: Secondary | ICD-10-CM

## 2020-09-18 DIAGNOSIS — J4540 Moderate persistent asthma, uncomplicated: Secondary | ICD-10-CM

## 2020-09-25 ENCOUNTER — Telehealth: Payer: Self-pay | Admitting: Pulmonary Disease

## 2020-09-25 NOTE — Telephone Encounter (Signed)
Called and spoke with Patient.  Patient stated she is having a PFT 09/28/20, and was scheduled to have covid testing today, but Patient is out of town.  Patient stated she can get a quick covid test tomorrow.  Patient she was told a quick test would be ok, if she brought results with her. Patient feels that covid testing is discriminating against people who have not received Covid vaccine. Advised covid testing was [employer]. Spoke with Tammy W to clarify covid testing. Covid test must be a PCR. Called and spoke with Patient. Offered to reschedule covid testing,  PFT, and OV.  Patient declined.  Patient stated she would cancel if results were not back in time for PFT. Nothing further at this time.

## 2020-09-28 ENCOUNTER — Encounter: Payer: Self-pay | Admitting: Pulmonary Disease

## 2020-09-28 ENCOUNTER — Ambulatory Visit (INDEPENDENT_AMBULATORY_CARE_PROVIDER_SITE_OTHER): Payer: Medicare Other | Admitting: Pulmonary Disease

## 2020-09-28 ENCOUNTER — Other Ambulatory Visit: Payer: Self-pay

## 2020-09-28 VITALS — BP 118/72 | HR 86 | Temp 97.1°F | Ht 61.0 in | Wt 187.0 lb

## 2020-09-28 DIAGNOSIS — J4540 Moderate persistent asthma, uncomplicated: Secondary | ICD-10-CM

## 2020-09-28 DIAGNOSIS — R911 Solitary pulmonary nodule: Secondary | ICD-10-CM

## 2020-09-28 LAB — PULMONARY FUNCTION TEST
DL/VA % pred: 115 %
DL/VA: 4.92 ml/min/mmHg/L
DLCO cor % pred: 99 %
DLCO cor: 17.66 ml/min/mmHg
DLCO unc % pred: 97 %
DLCO unc: 17.15 ml/min/mmHg
FEF 25-75 Post: 1.24 L/sec
FEF 25-75 Pre: 1.18 L/sec
FEF2575-%Change-Post: 4 %
FEF2575-%Pred-Post: 68 %
FEF2575-%Pred-Pre: 64 %
FEV1-%Change-Post: 0 %
FEV1-%Pred-Post: 67 %
FEV1-%Pred-Pre: 67 %
FEV1-Post: 1.38 L
FEV1-Pre: 1.37 L
FEV1FVC-%Change-Post: 4 %
FEV1FVC-%Pred-Pre: 101 %
FEV6-%Change-Post: -3 %
FEV6-%Pred-Post: 66 %
FEV6-%Pred-Pre: 68 %
FEV6-Post: 1.71 L
FEV6-Pre: 1.77 L
FEV6FVC-%Pred-Post: 104 %
FEV6FVC-%Pred-Pre: 104 %
FVC-%Change-Post: -3 %
FVC-%Pred-Post: 63 %
FVC-%Pred-Pre: 65 %
FVC-Post: 1.71 L
FVC-Pre: 1.77 L
Post FEV1/FVC ratio: 81 %
Post FEV6/FVC ratio: 100 %
Pre FEV1/FVC ratio: 77 %
Pre FEV6/FVC Ratio: 100 %
RV % pred: 121 %
RV: 2.42 L
TLC % pred: 91 %
TLC: 4.23 L

## 2020-09-28 NOTE — Progress Notes (Signed)
Full PFT performed today. °

## 2020-09-28 NOTE — Patient Instructions (Addendum)
I have reviewed your CT scan which shows lung nodules which appears benign We will continue to keep a watch on it Order CT chest without contrast in 6 months  Your lung function test looked okay I suspect that your shortness of breath may be from weight We will refer you to health and wellness for weight loss Can start cutting down on the number of puffs of Symbicort.  Follow back in clinic in 3 months.

## 2020-09-28 NOTE — Progress Notes (Signed)
Michele Thornton    [id number]    1951-03-06  Primary Care Physician:Dough, Doris Cheadle, MD  Referring Physician: Olive Bass, MD [address] [place],  [place] [address]  Chief complaint:   Consult for abnormal chest x-ray, asthma  HPI:  69 year old with history of asthma, allergies, postnasal drip Had a chest x-ray at primary care which showed granulomas, calcified lymph node and sent here for further evaluation  Has history of asthma which was diagnosed over 20 years ago.  Maintained on Symbicort for several years Also has a rescue inhaler which she does not use regularly Complains of mild dyspnea on exertion, nasal congestion with postnasal drip, nonproductive cough. No GERD symptoms  Patient had a bronchoscopy done at [facility] in the 1980s for evaluation of "spot on her lung".  Per patient and husband brushings were attempted but procedure aborted as she developed bleeding.  Results were nondiagnostic.  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  No down pillows or comforters Smoking history: 40-pack-year smoker.  Quit in 2008 Travel history: Grew up in [place].  Moved to [place] in 1992 Relevant family history: No significant family history of lung disease  Interim history: Here for review of CT scan States that breathing is doing well with no issues.  Outpatient Encounter Medications as of 09/28/2020  Medication Sig  . acetaminophen (TYLENOL) 500 MG tablet Take 1,000 mg by mouth 3 (three) times daily as needed (for pain).  Marland Kitchen albuterol (VENTOLIN HFA) 108 (90 BASE) MCG/ACT inhaler Inhale 2 puffs into the lungs every 4 (four) hours as needed for wheezing or shortness of breath.  Marland Kitchen aspirin EC 325 MG EC tablet Take 1 tablet (325 mg total) by mouth 2 (two) times daily. Take twice a day for three weeks to prevent blood clots  . budesonide-formoterol (SYMBICORT) 160-4.5 MCG/ACT  inhaler Inhale 2 puffs into the lungs 2 (two) times daily.  . Carboxymeth-Glycerin-Polysorb (REFRESH OPTIVE ADVANCED) 0.5-1-0.5 % SOLN Place 1 drop into both eyes at bedtime.  . diphenhydrAMINE (ALLERGY RELIEF) 25 MG tablet Take 25 mg by mouth 2 (two) times daily as needed (for allergies.).   Marland Kitchen loratadine (CLARITIN) 10 MG tablet Take 10 mg by mouth daily as needed (FOR RUNNY NOSE/ALLERGIES.).  Marland Kitchen naphazoline-pheniramine (VISINE-A) 0.025-0.3 % ophthalmic solution Place 1 drop into both eyes 4 (four) times daily as needed (for irritation/allergy eyes.).  . [DISCONTINUED] methocarbamol (ROBAXIN) 500 MG tablet Take 1 tablet (500 mg total) by mouth every 6 (six) hours as needed for muscle spasms. (Patient not taking: Reported on 09/28/2020)  . [DISCONTINUED] traMADol (ULTRAM) 50 MG tablet Take 1-2 tablets (50-100 mg total) by mouth every 6 (six) hours as needed for moderate pain (not responsive to oxycodone). (Patient not taking: Reported on 09/28/2020)   No facility-administered encounter medications on file as of 09/28/2020.   Physical Exam: Blood pressure 118/72, pulse 86, temperature (!) 97.1 F (36.2 C), temperature source Other (Comment), height 5\' 1"  (1.549 m), weight 187 lb (84.8 kg), SpO2 96 %. Gen:      No acute distress HEENT:  EOMI, sclera anicteric Neck:     No masses; no thyromegaly Lungs:    Clear to auscultation bilaterally; normal respiratory effort CV:         Regular rate and rhythm; no murmurs Abd:      + bowel sounds; soft, non-tender; no palpable masses, no distension  Ext:    No edema; adequate peripheral perfusion Skin:      Warm and dry; no rash Neuro: alert and oriented x 3 Psych: normal mood and affect  Data Reviewed: Imaging: Chest x-ray [facility] 05/25/20 Calcified granuloma right upper lobe. Lungs otherwise clear. Heart  size normal. Nonenlarged calcified mediastinal lymph nodes indicates  prior granulomatous disease. The eggshell type appearance of several  of these  lymph nodes raises question of prior sarcoidosis, although  residua of tuberculosis occasionally can present in this manner. No  adenopathy evident.  CT chest 09/16/2020-pending radiology read  PFTs: 09/28/2020 FVC 1.71 [63%], FEV1 1.38 [67%], F/F 81, TLC 4.23 [91%), DLCO 17.15 [97%) Normal test  Labs: CBC 08/21/2020-WBC 5.9, eos 6.4%, absolute eosinophil count 378.  Assessment:  Lung nodule, abnormal chest x-ray CT scan reviewed with what appears to be on my read a nodule in the right middle lobe.  This has not been drawn by radiology yet.  I have contacted them to get a interpretation and comparison to prior  In the meantime we will order a follow-up CT without contrast in 6 months.  Asthma Continue Symbicort PFTs with no significant obstruction, restriction or diffusion defect.  There is reduction in FVC and ERV suggestive of body habitus  Recommended weight loss with diet and exercise and referral to weight loss clinic  Chronic cough Upper airway cough syndrome with postnasal drip. Use over-the-counter antihistamines and steroid nasal spray as needed  Plan/Recommendations: - CT chest without contrast - Continue Symbicort - Weight loss clinic referral  Chilton Greathouse MD [facility] 09/28/2020, 10:11 AM  CC: Olive Bass, MD

## 2020-12-27 ENCOUNTER — Ambulatory Visit (INDEPENDENT_AMBULATORY_CARE_PROVIDER_SITE_OTHER): Payer: PPO | Admitting: Bariatrics

## 2021-01-10 ENCOUNTER — Ambulatory Visit (INDEPENDENT_AMBULATORY_CARE_PROVIDER_SITE_OTHER): Payer: PPO | Admitting: Bariatrics

## 2021-02-27 ENCOUNTER — Telehealth: Payer: Self-pay | Admitting: Pulmonary Disease

## 2021-02-27 NOTE — Telephone Encounter (Signed)
I did not get the actual x-ray from [facility] but reviewed the report. The CT scan showed some mild inflammation and nodule that was not obvious on the chest x-ray.  Given her smoking history I would recommend that she proceed with a follow-up 34-month CT scan without contrast in April

## 2021-02-27 NOTE — Telephone Encounter (Signed)
PM pt is wanting to make sure that the CT that was ordered for April is still needed.  She wanted to make sure that you got the previous xray and not just the reports.  Thanks

## 2021-02-27 NOTE — Telephone Encounter (Signed)
Called and spoke with Patient. Dr Shirlee More recommendations given. Patient stated she would keep 03/2021 CT appointment. Nothing further at this time.

## 2021-03-19 NOTE — Telephone Encounter (Signed)
Dr Isaiah Serge, pt is needing f/u with you sometime for after her CT Chest 03/23/21  She requests a video visit since she lives in [place] and already has to travel here for the CT  Please advise if this is okay with you, thanks!

## 2021-03-23 ENCOUNTER — Other Ambulatory Visit: Payer: Self-pay

## 2021-03-23 ENCOUNTER — Ambulatory Visit (INDEPENDENT_AMBULATORY_CARE_PROVIDER_SITE_OTHER)
Admission: RE | Admit: 2021-03-23 | Discharge: 2021-03-23 | Disposition: A | Discharge status: Home / Self Care | Payer: Medicare Other | Source: Ambulatory Visit | Attending: Pulmonary Disease | Admitting: Pulmonary Disease

## 2021-03-23 DIAGNOSIS — R911 Solitary pulmonary nodule: Secondary | ICD-10-CM | POA: Diagnosis not present

## 2021-03-26 NOTE — Telephone Encounter (Signed)
Called and spoke with pt to see if I could get her scheduled for a follow up with Dr. Isaiah Serge to discuss the results of the CT. Pt said she will have to check her schedule and then call the office back. Told her at this current moment, Dr. Isaiah Serge had one opening Wednesday, 4/20, one opening Thursday, 4/21, and one opening Friday, 4/22. Will keep encounter open until pt calls back to get appt scheduled.

## 2021-03-26 NOTE — Telephone Encounter (Signed)
Scheduled video visit w/ Dr. Isaiah Serge for 03/29/2021 at 10:15.

## 2021-03-26 NOTE — Telephone Encounter (Signed)
Ok to do video visit

## 2021-03-29 ENCOUNTER — Other Ambulatory Visit: Payer: Self-pay

## 2021-03-29 ENCOUNTER — Telehealth (INDEPENDENT_AMBULATORY_CARE_PROVIDER_SITE_OTHER): Payer: Medicare Other | Admitting: Pulmonary Disease

## 2021-03-29 DIAGNOSIS — R911 Solitary pulmonary nodule: Secondary | ICD-10-CM

## 2021-03-29 DIAGNOSIS — J4540 Moderate persistent asthma, uncomplicated: Secondary | ICD-10-CM

## 2021-03-29 NOTE — Progress Notes (Signed)
AVS mailed to Patient. Patient is moving to [place]. Patient instructed to establish  and  follow up with pulmonologist in [place]. Patient recommended by Dr.Mannam to have sputum cultures to evaluate MAI once established with new pulmonologist and is needing a follow up chest CT in 1 year.

## 2021-03-29 NOTE — Progress Notes (Signed)
JOE TANNEY    [id number]    1951-06-12  Primary Care Physician:Dough, Doris Cheadle, MD  Referring Physician: Olive Bass, MD [address] [place],  [place] [address]  Virtual Visit via Video Note  I connected with Michele Thornton on 03/29/21 at 10:15 AM EDT by a video enabled telemedicine application and verified that I am speaking with the correct person using two identifiers.  Location: Patient: Home Provider: [address]   I discussed the limitations of evaluation and management by telemedicine and the availability of in person appointments. The patient expressed understanding and agreed to proceed.   Chief complaint:   Follow-up for bronchiectasis, asthma  HPI:  70 year old with history of asthma, allergies, postnasal drip Had a chest x-ray at primary care which showed granulomas, calcified lymph node and sent here for further evaluation  Has history of asthma which was diagnosed over 20 years ago.  Maintained on Symbicort for several years Also has a rescue inhaler which she does not use regularly Complains of mild dyspnea on exertion, nasal congestion with postnasal drip, nonproductive cough. No GERD symptoms  Patient had a bronchoscopy done at [facility] in the 1980s for evaluation of "spot on her lung".  Per patient and husband brushings were attempted but procedure aborted as she developed bleeding.  Results were nondiagnostic.  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  No down pillows or comforters Smoking history: 40-pack-year smoker.  Quit in 2008 Travel history: Grew up in [place].  Moved to [place] in 1992 Relevant family history: No significant family history of lung disease  Interim history: Breathing is doing well with no issues She had recently traveled to [place] and got multiple rounds of antibiotics from January to March 2022 for bronchitis,  pneumonia.  She feels back to baseline with no cough or sputum production.  Denies fevers Using Symbicort.  Does not need to use rescue rescue inhaler on a regular basis  Outpatient Encounter Medications as of 03/29/2021  Medication Sig  . albuterol (VENTOLIN HFA) 108 (90 Base) MCG/ACT inhaler Inhale 2 puffs into the lungs every 4 (four) hours as needed for wheezing or shortness of breath.  . budesonide-formoterol (SYMBICORT) 160-4.5 MCG/ACT inhaler Inhale 2 puffs into the lungs 2 (two) times daily.  . Carboxymeth-Glycerin-Polysorb 0.5-1-0.5 % SOLN Place 1 drop into both eyes at bedtime.  Marland Kitchen levocetirizine (XYZAL) 5 MG tablet Take 5 mg by mouth every evening.  . naphazoline-pheniramine (NAPHCON-A) 0.025-0.3 % ophthalmic solution Place 1 drop into both eyes 4 (four) times daily as needed (for irritation/allergy eyes.).  Marland Kitchen acetaminophen (TYLENOL) 500 MG tablet Take 1,000 mg by mouth 3 (three) times daily as needed (for pain). (Patient not taking: Reported on 03/29/2021)  . aspirin EC 325 MG EC tablet Take 1 tablet (325 mg total) by mouth 2 (two) times daily. Take twice a day for three weeks to prevent blood clots (Patient not taking: Reported on 03/29/2021)  . diphenhydrAMINE (BENADRYL) 25 MG tablet Take 25 mg by mouth 2 (two) times daily as needed (for allergies.).  (Patient not taking: Reported on 03/29/2021)  . loratadine (CLARITIN) 10 MG tablet Take 10 mg by mouth daily as needed (FOR RUNNY NOSE/ALLERGIES.). (Patient not taking: Reported on 03/29/2021)   No facility-administered encounter medications on file as of 03/29/2021.   Physical Exam: There were no vitals taken for this visit. Gen:  No acute distress HEENT:  EOMI, sclera anicteric Neck:     No masses; no thyromegaly Lungs:    Clear to auscultation bilaterally; normal respiratory effort CV:         Regular rate and rhythm; no murmurs Abd:      + bowel sounds; soft, non-tender; no palpable masses, no distension Ext:    No edema;  adequate peripheral perfusion Skin:      Warm and dry; no rash Neuro: alert and oriented x 3 Psych: normal mood and affect  Data Reviewed: Imaging: Chest x-ray [facility] 05/25/20 Calcified granuloma right upper lobe. Lungs otherwise clear. Heart  size normal. Nonenlarged calcified mediastinal lymph nodes indicates  prior granulomatous disease. The eggshell type appearance of several  of these lymph nodes raises question of prior sarcoidosis, although  residua of tuberculosis occasionally can present in this manner. No  adenopathy evident.  CT chest 09/16/2020- mild bronchiectatic changes on the right, small groundglass nodules in the right lower lobe.  Subcentimeter pulmonary nodules  CT chest 03/23/2021- similar changes of mild bronchiectasis on the right, stable lung nodules.  Signs of prior granulomatous disease with calcified nodules in the mediastinum, liver and spleen. I have reviewed the images personally.  PFTs: 09/28/2020 FVC 1.71 [63%], FEV1 1.38 [67%], F/F 81, TLC 4.23 [91%), DLCO 17.15 [97%) Normal test  Labs: CBC 08/21/2020-WBC 5.9, eos 6.4%, absolute eosinophil count 378.  Assessment:  Abnormal CT, pulmonary nodules She has evidence of prior granulomatous disease, mild bronchiectasis which appears unchanged.  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  Recommended evaluation with sputum cultures for AFB and follow-up CT in 1 year  Patient is moving to [place] in the next 2 months.  I have discussed my recommendations of follow-up with her.  She will establish with a new pulmonary provider in her new location and resume care.  Asthma Continue Symbicort PFTs with no significant obstruction, restriction or diffusion defect.  There is reduction in FVC and ERV suggestive of body habitus  Recommended weight loss with diet and exercise and referral to weight loss clinic  Chronic cough Upper airway cough syndrome with  postnasal drip. Use over-the-counter antihistamines and steroid nasal spray as needed  Plan/Recommendations: - CT chest without contrast in 1 year - Continue Symbicort  Follow-up as needed  I discussed the assessment and treatment plan with the patient. The patient was provided an opportunity to ask questions and all were answered. The patient agreed with the plan and demonstrated an understanding of the instructions.   The patient was advised to call back or seek an in-person evaluation if the symptoms worsen or if the condition fails to improve as anticipated.  Video time 7m 30s   Chilton Greathouse MD [facility] 03/29/2021, 10:20 AM  CC: Olive Bass, MD

## 2021-03-29 NOTE — Patient Instructions (Addendum)
I have reviewed the CT which shows some mild inflammation of the lungs and lung nodules.  This evidence of prior inflammation/infection in the liver which does not appear active I recommend you get sputum for culture to evaluate a chronic infection called MAI and a follow-up CT in 1 year  Since you are moving to [place] this can be done once you establish with a new provider  Please call us back as needed for any pulmonary care in the [place] area

## 2022-07-17 ENCOUNTER — Encounter (INDEPENDENT_AMBULATORY_CARE_PROVIDER_SITE_OTHER): Payer: Self-pay
